# Patient Record
Sex: Male | Born: 1945
Health system: Southern US, Community
[De-identification: ages and names within clinical notes are randomized; demographics above are authoritative.]

## PROBLEM LIST (undated history)

## (undated) DIAGNOSIS — I10 Essential (primary) hypertension: Secondary | ICD-10-CM

## (undated) DIAGNOSIS — F4310 Post-traumatic stress disorder, unspecified: Secondary | ICD-10-CM

## (undated) DIAGNOSIS — C801 Malignant (primary) neoplasm, unspecified: Secondary | ICD-10-CM

## (undated) DIAGNOSIS — J449 Chronic obstructive pulmonary disease, unspecified: Secondary | ICD-10-CM

## (undated) DIAGNOSIS — E119 Type 2 diabetes mellitus without complications: Secondary | ICD-10-CM

## (undated) DIAGNOSIS — E7800 Pure hypercholesterolemia, unspecified: Secondary | ICD-10-CM

## (undated) HISTORY — PX: ABDOMINAL SURGERY: SHX537

## (undated) HISTORY — PX: CHOLECYSTECTOMY: SHX55

---

## 2010-07-07 ENCOUNTER — Emergency Department: Payer: Self-pay | Admitting: Emergency Medicine

## 2010-09-22 ENCOUNTER — Ambulatory Visit: Payer: Self-pay | Admitting: Family Medicine

## 2010-10-07 ENCOUNTER — Ambulatory Visit: Payer: Self-pay | Admitting: Family Medicine

## 2010-11-05 ENCOUNTER — Ambulatory Visit: Payer: Self-pay | Admitting: Family Medicine

## 2012-08-10 ENCOUNTER — Ambulatory Visit: Payer: Self-pay | Admitting: Internal Medicine

## 2012-11-04 ENCOUNTER — Ambulatory Visit: Payer: Self-pay | Admitting: Emergency Medicine

## 2013-09-07 DIAGNOSIS — M999 Biomechanical lesion, unspecified: Secondary | ICD-10-CM | POA: Diagnosis not present

## 2013-09-07 DIAGNOSIS — M9981 Other biomechanical lesions of cervical region: Secondary | ICD-10-CM | POA: Diagnosis not present

## 2013-09-07 DIAGNOSIS — M436 Torticollis: Secondary | ICD-10-CM | POA: Diagnosis not present

## 2013-09-07 DIAGNOSIS — M5412 Radiculopathy, cervical region: Secondary | ICD-10-CM | POA: Diagnosis not present

## 2013-10-16 DIAGNOSIS — H04129 Dry eye syndrome of unspecified lacrimal gland: Secondary | ICD-10-CM | POA: Diagnosis not present

## 2013-10-24 ENCOUNTER — Ambulatory Visit: Payer: Self-pay | Admitting: Family Medicine

## 2013-10-24 DIAGNOSIS — J438 Other emphysema: Secondary | ICD-10-CM | POA: Diagnosis not present

## 2013-12-07 DIAGNOSIS — I259 Chronic ischemic heart disease, unspecified: Secondary | ICD-10-CM | POA: Diagnosis not present

## 2013-12-07 DIAGNOSIS — I10 Essential (primary) hypertension: Secondary | ICD-10-CM | POA: Diagnosis not present

## 2013-12-07 DIAGNOSIS — E785 Hyperlipidemia, unspecified: Secondary | ICD-10-CM | POA: Diagnosis not present

## 2013-12-07 DIAGNOSIS — R42 Dizziness and giddiness: Secondary | ICD-10-CM | POA: Diagnosis not present

## 2014-01-06 DIAGNOSIS — G4733 Obstructive sleep apnea (adult) (pediatric): Secondary | ICD-10-CM | POA: Diagnosis not present

## 2014-01-11 DIAGNOSIS — R1900 Intra-abdominal and pelvic swelling, mass and lump, unspecified site: Secondary | ICD-10-CM | POA: Diagnosis not present

## 2014-01-11 DIAGNOSIS — R1909 Other intra-abdominal and pelvic swelling, mass and lump: Secondary | ICD-10-CM | POA: Diagnosis not present

## 2014-01-11 DIAGNOSIS — Z85048 Personal history of other malignant neoplasm of rectum, rectosigmoid junction, and anus: Secondary | ICD-10-CM | POA: Diagnosis not present

## 2014-01-18 DIAGNOSIS — R3915 Urgency of urination: Secondary | ICD-10-CM | POA: Diagnosis present

## 2014-01-18 DIAGNOSIS — G4700 Insomnia, unspecified: Secondary | ICD-10-CM | POA: Diagnosis present

## 2014-01-18 DIAGNOSIS — E785 Hyperlipidemia, unspecified: Secondary | ICD-10-CM | POA: Diagnosis not present

## 2014-01-18 DIAGNOSIS — M4802 Spinal stenosis, cervical region: Secondary | ICD-10-CM | POA: Diagnosis present

## 2014-01-18 DIAGNOSIS — I10 Essential (primary) hypertension: Secondary | ICD-10-CM | POA: Diagnosis present

## 2014-01-18 DIAGNOSIS — Z87891 Personal history of nicotine dependence: Secondary | ICD-10-CM | POA: Diagnosis not present

## 2014-01-18 DIAGNOSIS — N401 Enlarged prostate with lower urinary tract symptoms: Secondary | ICD-10-CM | POA: Diagnosis present

## 2014-01-18 DIAGNOSIS — M5412 Radiculopathy, cervical region: Secondary | ICD-10-CM | POA: Diagnosis not present

## 2014-01-18 DIAGNOSIS — Z85038 Personal history of other malignant neoplasm of large intestine: Secondary | ICD-10-CM | POA: Diagnosis not present

## 2014-01-18 DIAGNOSIS — R079 Chest pain, unspecified: Secondary | ICD-10-CM | POA: Diagnosis not present

## 2014-01-18 DIAGNOSIS — R279 Unspecified lack of coordination: Secondary | ICD-10-CM | POA: Diagnosis not present

## 2014-01-18 DIAGNOSIS — R5381 Other malaise: Secondary | ICD-10-CM | POA: Diagnosis not present

## 2014-01-18 DIAGNOSIS — R1312 Dysphagia, oropharyngeal phase: Secondary | ICD-10-CM | POA: Diagnosis not present

## 2014-01-18 DIAGNOSIS — I2510 Atherosclerotic heart disease of native coronary artery without angina pectoris: Secondary | ICD-10-CM | POA: Diagnosis present

## 2014-01-18 DIAGNOSIS — I69998 Other sequelae following unspecified cerebrovascular disease: Secondary | ICD-10-CM | POA: Diagnosis not present

## 2014-01-18 DIAGNOSIS — E119 Type 2 diabetes mellitus without complications: Secondary | ICD-10-CM | POA: Diagnosis present

## 2014-01-18 DIAGNOSIS — M47812 Spondylosis without myelopathy or radiculopathy, cervical region: Secondary | ICD-10-CM | POA: Diagnosis not present

## 2014-01-18 DIAGNOSIS — R29898 Other symptoms and signs involving the musculoskeletal system: Secondary | ICD-10-CM | POA: Diagnosis present

## 2014-01-18 DIAGNOSIS — R5383 Other fatigue: Secondary | ICD-10-CM | POA: Diagnosis not present

## 2014-01-18 DIAGNOSIS — Z8673 Personal history of transient ischemic attack (TIA), and cerebral infarction without residual deficits: Secondary | ICD-10-CM | POA: Diagnosis not present

## 2014-01-18 DIAGNOSIS — F4310 Post-traumatic stress disorder, unspecified: Secondary | ICD-10-CM | POA: Diagnosis present

## 2014-01-18 DIAGNOSIS — M5030 Other cervical disc degeneration, unspecified cervical region: Secondary | ICD-10-CM | POA: Diagnosis not present

## 2014-01-18 DIAGNOSIS — R29818 Other symptoms and signs involving the nervous system: Secondary | ICD-10-CM | POA: Diagnosis not present

## 2014-01-18 DIAGNOSIS — G4733 Obstructive sleep apnea (adult) (pediatric): Secondary | ICD-10-CM | POA: Diagnosis present

## 2014-01-18 DIAGNOSIS — Z9861 Coronary angioplasty status: Secondary | ICD-10-CM | POA: Diagnosis not present

## 2014-01-18 DIAGNOSIS — R209 Unspecified disturbances of skin sensation: Secondary | ICD-10-CM | POA: Diagnosis not present

## 2014-01-18 DIAGNOSIS — J3489 Other specified disorders of nose and nasal sinuses: Secondary | ICD-10-CM | POA: Diagnosis not present

## 2014-01-18 DIAGNOSIS — I6350 Cerebral infarction due to unspecified occlusion or stenosis of unspecified cerebral artery: Secondary | ICD-10-CM | POA: Diagnosis not present

## 2014-01-18 DIAGNOSIS — K219 Gastro-esophageal reflux disease without esophagitis: Secondary | ICD-10-CM | POA: Diagnosis present

## 2014-02-11 DIAGNOSIS — M79609 Pain in unspecified limb: Secondary | ICD-10-CM | POA: Diagnosis not present

## 2014-02-11 DIAGNOSIS — M9981 Other biomechanical lesions of cervical region: Secondary | ICD-10-CM | POA: Diagnosis not present

## 2014-02-11 DIAGNOSIS — M999 Biomechanical lesion, unspecified: Secondary | ICD-10-CM | POA: Diagnosis not present

## 2014-02-12 DIAGNOSIS — M79609 Pain in unspecified limb: Secondary | ICD-10-CM | POA: Diagnosis not present

## 2014-02-12 DIAGNOSIS — M9981 Other biomechanical lesions of cervical region: Secondary | ICD-10-CM | POA: Diagnosis not present

## 2014-02-12 DIAGNOSIS — M999 Biomechanical lesion, unspecified: Secondary | ICD-10-CM | POA: Diagnosis not present

## 2014-02-14 DIAGNOSIS — M999 Biomechanical lesion, unspecified: Secondary | ICD-10-CM | POA: Diagnosis not present

## 2014-02-14 DIAGNOSIS — M9981 Other biomechanical lesions of cervical region: Secondary | ICD-10-CM | POA: Diagnosis not present

## 2014-02-14 DIAGNOSIS — M79609 Pain in unspecified limb: Secondary | ICD-10-CM | POA: Diagnosis not present

## 2014-02-22 DIAGNOSIS — M545 Low back pain, unspecified: Secondary | ICD-10-CM | POA: Diagnosis not present

## 2014-02-22 DIAGNOSIS — M999 Biomechanical lesion, unspecified: Secondary | ICD-10-CM | POA: Diagnosis not present

## 2014-02-28 DIAGNOSIS — M545 Low back pain, unspecified: Secondary | ICD-10-CM | POA: Diagnosis not present

## 2014-02-28 DIAGNOSIS — M999 Biomechanical lesion, unspecified: Secondary | ICD-10-CM | POA: Diagnosis not present

## 2014-03-05 ENCOUNTER — Ambulatory Visit: Payer: Self-pay | Admitting: Emergency Medicine

## 2014-03-05 DIAGNOSIS — E119 Type 2 diabetes mellitus without complications: Secondary | ICD-10-CM | POA: Diagnosis not present

## 2014-03-05 DIAGNOSIS — Z8673 Personal history of transient ischemic attack (TIA), and cerebral infarction without residual deficits: Secondary | ICD-10-CM | POA: Diagnosis not present

## 2014-03-05 DIAGNOSIS — E7800 Pure hypercholesterolemia, unspecified: Secondary | ICD-10-CM | POA: Diagnosis not present

## 2014-03-05 DIAGNOSIS — Z79899 Other long term (current) drug therapy: Secondary | ICD-10-CM | POA: Diagnosis not present

## 2014-03-05 DIAGNOSIS — F17200 Nicotine dependence, unspecified, uncomplicated: Secondary | ICD-10-CM | POA: Diagnosis not present

## 2014-03-05 DIAGNOSIS — Z794 Long term (current) use of insulin: Secondary | ICD-10-CM | POA: Diagnosis not present

## 2014-03-05 DIAGNOSIS — S61209A Unspecified open wound of unspecified finger without damage to nail, initial encounter: Secondary | ICD-10-CM | POA: Diagnosis not present

## 2014-03-05 DIAGNOSIS — I10 Essential (primary) hypertension: Secondary | ICD-10-CM | POA: Diagnosis not present

## 2014-03-12 DIAGNOSIS — M9981 Other biomechanical lesions of cervical region: Secondary | ICD-10-CM | POA: Diagnosis not present

## 2014-03-12 DIAGNOSIS — R209 Unspecified disturbances of skin sensation: Secondary | ICD-10-CM | POA: Diagnosis not present

## 2014-03-12 DIAGNOSIS — M999 Biomechanical lesion, unspecified: Secondary | ICD-10-CM | POA: Diagnosis not present

## 2014-03-12 DIAGNOSIS — M546 Pain in thoracic spine: Secondary | ICD-10-CM | POA: Diagnosis not present

## 2014-03-13 DIAGNOSIS — F3289 Other specified depressive episodes: Secondary | ICD-10-CM | POA: Diagnosis not present

## 2014-03-13 DIAGNOSIS — J310 Chronic rhinitis: Secondary | ICD-10-CM | POA: Diagnosis not present

## 2014-03-13 DIAGNOSIS — G4733 Obstructive sleep apnea (adult) (pediatric): Secondary | ICD-10-CM | POA: Diagnosis not present

## 2014-03-13 DIAGNOSIS — F329 Major depressive disorder, single episode, unspecified: Secondary | ICD-10-CM | POA: Diagnosis not present

## 2014-04-02 DIAGNOSIS — M9981 Other biomechanical lesions of cervical region: Secondary | ICD-10-CM | POA: Diagnosis not present

## 2014-04-02 DIAGNOSIS — M999 Biomechanical lesion, unspecified: Secondary | ICD-10-CM | POA: Diagnosis not present

## 2014-04-02 DIAGNOSIS — M546 Pain in thoracic spine: Secondary | ICD-10-CM | POA: Diagnosis not present

## 2014-04-02 DIAGNOSIS — R209 Unspecified disturbances of skin sensation: Secondary | ICD-10-CM | POA: Diagnosis not present

## 2014-04-12 DIAGNOSIS — M999 Biomechanical lesion, unspecified: Secondary | ICD-10-CM | POA: Diagnosis not present

## 2014-04-12 DIAGNOSIS — M9981 Other biomechanical lesions of cervical region: Secondary | ICD-10-CM | POA: Diagnosis not present

## 2014-04-12 DIAGNOSIS — M79609 Pain in unspecified limb: Secondary | ICD-10-CM | POA: Diagnosis not present

## 2014-04-12 DIAGNOSIS — M546 Pain in thoracic spine: Secondary | ICD-10-CM | POA: Diagnosis not present

## 2014-04-15 DIAGNOSIS — M546 Pain in thoracic spine: Secondary | ICD-10-CM | POA: Diagnosis not present

## 2014-04-15 DIAGNOSIS — M79609 Pain in unspecified limb: Secondary | ICD-10-CM | POA: Diagnosis not present

## 2014-04-15 DIAGNOSIS — M999 Biomechanical lesion, unspecified: Secondary | ICD-10-CM | POA: Diagnosis not present

## 2014-04-15 DIAGNOSIS — M9981 Other biomechanical lesions of cervical region: Secondary | ICD-10-CM | POA: Diagnosis not present

## 2014-04-16 DIAGNOSIS — M9981 Other biomechanical lesions of cervical region: Secondary | ICD-10-CM | POA: Diagnosis not present

## 2014-04-16 DIAGNOSIS — M546 Pain in thoracic spine: Secondary | ICD-10-CM | POA: Diagnosis not present

## 2014-04-16 DIAGNOSIS — M999 Biomechanical lesion, unspecified: Secondary | ICD-10-CM | POA: Diagnosis not present

## 2014-04-16 DIAGNOSIS — M79609 Pain in unspecified limb: Secondary | ICD-10-CM | POA: Diagnosis not present

## 2014-04-23 DIAGNOSIS — M9981 Other biomechanical lesions of cervical region: Secondary | ICD-10-CM | POA: Diagnosis not present

## 2014-04-23 DIAGNOSIS — M999 Biomechanical lesion, unspecified: Secondary | ICD-10-CM | POA: Diagnosis not present

## 2014-04-23 DIAGNOSIS — M79609 Pain in unspecified limb: Secondary | ICD-10-CM | POA: Diagnosis not present

## 2014-05-11 ENCOUNTER — Ambulatory Visit: Payer: Self-pay | Admitting: Family Medicine

## 2014-05-11 DIAGNOSIS — N419 Inflammatory disease of prostate, unspecified: Secondary | ICD-10-CM | POA: Diagnosis not present

## 2014-05-11 DIAGNOSIS — I2510 Atherosclerotic heart disease of native coronary artery without angina pectoris: Secondary | ICD-10-CM | POA: Diagnosis not present

## 2014-05-11 DIAGNOSIS — I10 Essential (primary) hypertension: Secondary | ICD-10-CM | POA: Diagnosis not present

## 2014-05-11 DIAGNOSIS — Z9861 Coronary angioplasty status: Secondary | ICD-10-CM | POA: Diagnosis not present

## 2014-05-11 DIAGNOSIS — E119 Type 2 diabetes mellitus without complications: Secondary | ICD-10-CM | POA: Diagnosis not present

## 2014-05-11 DIAGNOSIS — E7800 Pure hypercholesterolemia, unspecified: Secondary | ICD-10-CM | POA: Diagnosis not present

## 2014-05-11 LAB — URINALYSIS, COMPLETE
Bacteria: NEGATIVE
Ketone: NEGATIVE
LEUKOCYTE ESTERASE: NEGATIVE
Nitrite: NEGATIVE
PH: 6 (ref 5.0–8.0)
Protein: NEGATIVE
SQUAMOUS EPITHELIAL: NONE SEEN
Specific Gravity: 1.01 (ref 1.000–1.030)

## 2014-05-20 DIAGNOSIS — M999 Biomechanical lesion, unspecified: Secondary | ICD-10-CM | POA: Diagnosis not present

## 2014-05-20 DIAGNOSIS — M9981 Other biomechanical lesions of cervical region: Secondary | ICD-10-CM | POA: Diagnosis not present

## 2014-05-20 DIAGNOSIS — M79609 Pain in unspecified limb: Secondary | ICD-10-CM | POA: Diagnosis not present

## 2014-06-10 DIAGNOSIS — I2510 Atherosclerotic heart disease of native coronary artery without angina pectoris: Secondary | ICD-10-CM | POA: Diagnosis not present

## 2014-06-10 DIAGNOSIS — I10 Essential (primary) hypertension: Secondary | ICD-10-CM | POA: Diagnosis not present

## 2014-06-10 DIAGNOSIS — R0981 Nasal congestion: Secondary | ICD-10-CM | POA: Diagnosis not present

## 2014-06-10 DIAGNOSIS — E780 Pure hypercholesterolemia: Secondary | ICD-10-CM | POA: Diagnosis not present

## 2014-06-17 DIAGNOSIS — I2510 Atherosclerotic heart disease of native coronary artery without angina pectoris: Secondary | ICD-10-CM | POA: Diagnosis not present

## 2014-06-17 DIAGNOSIS — R0789 Other chest pain: Secondary | ICD-10-CM | POA: Diagnosis not present

## 2014-06-19 DIAGNOSIS — G4733 Obstructive sleep apnea (adult) (pediatric): Secondary | ICD-10-CM | POA: Diagnosis not present

## 2014-06-19 DIAGNOSIS — J309 Allergic rhinitis, unspecified: Secondary | ICD-10-CM | POA: Diagnosis not present

## 2014-06-19 DIAGNOSIS — J343 Hypertrophy of nasal turbinates: Secondary | ICD-10-CM | POA: Diagnosis not present

## 2014-06-19 DIAGNOSIS — J342 Deviated nasal septum: Secondary | ICD-10-CM | POA: Diagnosis not present

## 2014-06-19 DIAGNOSIS — R0602 Shortness of breath: Secondary | ICD-10-CM | POA: Diagnosis not present

## 2014-06-19 DIAGNOSIS — J324 Chronic pansinusitis: Secondary | ICD-10-CM | POA: Diagnosis not present

## 2014-07-09 DIAGNOSIS — M546 Pain in thoracic spine: Secondary | ICD-10-CM | POA: Diagnosis not present

## 2014-07-09 DIAGNOSIS — M5136 Other intervertebral disc degeneration, lumbar region: Secondary | ICD-10-CM | POA: Diagnosis not present

## 2014-07-09 DIAGNOSIS — M5137 Other intervertebral disc degeneration, lumbosacral region: Secondary | ICD-10-CM | POA: Diagnosis not present

## 2014-07-09 DIAGNOSIS — M9901 Segmental and somatic dysfunction of cervical region: Secondary | ICD-10-CM | POA: Diagnosis not present

## 2014-07-09 DIAGNOSIS — M542 Cervicalgia: Secondary | ICD-10-CM | POA: Diagnosis not present

## 2014-07-09 DIAGNOSIS — M9902 Segmental and somatic dysfunction of thoracic region: Secondary | ICD-10-CM | POA: Diagnosis not present

## 2014-08-14 DIAGNOSIS — M1712 Unilateral primary osteoarthritis, left knee: Secondary | ICD-10-CM | POA: Diagnosis not present

## 2014-09-04 DIAGNOSIS — J329 Chronic sinusitis, unspecified: Secondary | ICD-10-CM | POA: Diagnosis not present

## 2014-09-04 DIAGNOSIS — J324 Chronic pansinusitis: Secondary | ICD-10-CM | POA: Diagnosis not present

## 2014-09-25 DIAGNOSIS — Z01818 Encounter for other preprocedural examination: Secondary | ICD-10-CM | POA: Diagnosis not present

## 2014-09-30 DIAGNOSIS — K219 Gastro-esophageal reflux disease without esophagitis: Secondary | ICD-10-CM | POA: Diagnosis present

## 2014-09-30 DIAGNOSIS — M179 Osteoarthritis of knee, unspecified: Secondary | ICD-10-CM | POA: Diagnosis present

## 2014-09-30 DIAGNOSIS — Z955 Presence of coronary angioplasty implant and graft: Secondary | ICD-10-CM | POA: Diagnosis not present

## 2014-09-30 DIAGNOSIS — E119 Type 2 diabetes mellitus without complications: Secondary | ICD-10-CM | POA: Diagnosis present

## 2014-09-30 DIAGNOSIS — Z85038 Personal history of other malignant neoplasm of large intestine: Secondary | ICD-10-CM | POA: Diagnosis not present

## 2014-09-30 DIAGNOSIS — Z880 Allergy status to penicillin: Secondary | ICD-10-CM | POA: Diagnosis not present

## 2014-09-30 DIAGNOSIS — I25119 Atherosclerotic heart disease of native coronary artery with unspecified angina pectoris: Secondary | ICD-10-CM | POA: Diagnosis present

## 2014-09-30 DIAGNOSIS — Z888 Allergy status to other drugs, medicaments and biological substances status: Secondary | ICD-10-CM | POA: Diagnosis not present

## 2014-09-30 DIAGNOSIS — I2510 Atherosclerotic heart disease of native coronary artery without angina pectoris: Secondary | ICD-10-CM | POA: Diagnosis not present

## 2014-09-30 DIAGNOSIS — Z8673 Personal history of transient ischemic attack (TIA), and cerebral infarction without residual deficits: Secondary | ICD-10-CM | POA: Diagnosis not present

## 2014-09-30 DIAGNOSIS — R071 Chest pain on breathing: Secondary | ICD-10-CM | POA: Diagnosis not present

## 2014-09-30 DIAGNOSIS — Z87891 Personal history of nicotine dependence: Secondary | ICD-10-CM | POA: Diagnosis not present

## 2014-09-30 DIAGNOSIS — Z6834 Body mass index (BMI) 34.0-34.9, adult: Secondary | ICD-10-CM | POA: Diagnosis not present

## 2014-09-30 DIAGNOSIS — F418 Other specified anxiety disorders: Secondary | ICD-10-CM | POA: Diagnosis present

## 2014-09-30 DIAGNOSIS — D696 Thrombocytopenia, unspecified: Secondary | ICD-10-CM | POA: Diagnosis not present

## 2014-09-30 DIAGNOSIS — M25562 Pain in left knee: Secondary | ICD-10-CM | POA: Diagnosis not present

## 2014-09-30 DIAGNOSIS — M1712 Unilateral primary osteoarthritis, left knee: Secondary | ICD-10-CM | POA: Diagnosis not present

## 2014-09-30 DIAGNOSIS — E669 Obesity, unspecified: Secondary | ICD-10-CM | POA: Diagnosis present

## 2014-09-30 DIAGNOSIS — E871 Hypo-osmolality and hyponatremia: Secondary | ICD-10-CM | POA: Diagnosis not present

## 2014-09-30 DIAGNOSIS — I252 Old myocardial infarction: Secondary | ICD-10-CM | POA: Diagnosis not present

## 2014-09-30 DIAGNOSIS — R079 Chest pain, unspecified: Secondary | ICD-10-CM | POA: Diagnosis not present

## 2014-09-30 DIAGNOSIS — I10 Essential (primary) hypertension: Secondary | ICD-10-CM | POA: Diagnosis present

## 2014-09-30 DIAGNOSIS — E785 Hyperlipidemia, unspecified: Secondary | ICD-10-CM | POA: Diagnosis present

## 2014-09-30 DIAGNOSIS — G4733 Obstructive sleep apnea (adult) (pediatric): Secondary | ICD-10-CM | POA: Diagnosis present

## 2014-09-30 DIAGNOSIS — Z794 Long term (current) use of insulin: Secondary | ICD-10-CM | POA: Diagnosis not present

## 2014-09-30 DIAGNOSIS — N289 Disorder of kidney and ureter, unspecified: Secondary | ICD-10-CM | POA: Diagnosis not present

## 2014-10-05 DIAGNOSIS — I252 Old myocardial infarction: Secondary | ICD-10-CM | POA: Diagnosis not present

## 2014-10-05 DIAGNOSIS — Z471 Aftercare following joint replacement surgery: Secondary | ICD-10-CM | POA: Diagnosis not present

## 2014-10-05 DIAGNOSIS — Z96652 Presence of left artificial knee joint: Secondary | ICD-10-CM | POA: Diagnosis not present

## 2014-10-05 DIAGNOSIS — E119 Type 2 diabetes mellitus without complications: Secondary | ICD-10-CM | POA: Diagnosis not present

## 2014-10-05 DIAGNOSIS — Z794 Long term (current) use of insulin: Secondary | ICD-10-CM | POA: Diagnosis not present

## 2014-10-05 DIAGNOSIS — F329 Major depressive disorder, single episode, unspecified: Secondary | ICD-10-CM | POA: Diagnosis not present

## 2014-10-05 DIAGNOSIS — G4733 Obstructive sleep apnea (adult) (pediatric): Secondary | ICD-10-CM | POA: Diagnosis not present

## 2014-10-05 DIAGNOSIS — I10 Essential (primary) hypertension: Secondary | ICD-10-CM | POA: Diagnosis not present

## 2014-10-07 DIAGNOSIS — E119 Type 2 diabetes mellitus without complications: Secondary | ICD-10-CM | POA: Diagnosis not present

## 2014-10-07 DIAGNOSIS — I10 Essential (primary) hypertension: Secondary | ICD-10-CM | POA: Diagnosis not present

## 2014-10-07 DIAGNOSIS — F329 Major depressive disorder, single episode, unspecified: Secondary | ICD-10-CM | POA: Diagnosis not present

## 2014-10-07 DIAGNOSIS — G4733 Obstructive sleep apnea (adult) (pediatric): Secondary | ICD-10-CM | POA: Diagnosis not present

## 2014-10-07 DIAGNOSIS — Z471 Aftercare following joint replacement surgery: Secondary | ICD-10-CM | POA: Diagnosis not present

## 2014-10-07 DIAGNOSIS — I252 Old myocardial infarction: Secondary | ICD-10-CM | POA: Diagnosis not present

## 2014-10-08 DIAGNOSIS — I10 Essential (primary) hypertension: Secondary | ICD-10-CM | POA: Diagnosis not present

## 2014-10-08 DIAGNOSIS — Z471 Aftercare following joint replacement surgery: Secondary | ICD-10-CM | POA: Diagnosis not present

## 2014-10-08 DIAGNOSIS — F329 Major depressive disorder, single episode, unspecified: Secondary | ICD-10-CM | POA: Diagnosis not present

## 2014-10-08 DIAGNOSIS — I252 Old myocardial infarction: Secondary | ICD-10-CM | POA: Diagnosis not present

## 2014-10-08 DIAGNOSIS — E119 Type 2 diabetes mellitus without complications: Secondary | ICD-10-CM | POA: Diagnosis not present

## 2014-10-08 DIAGNOSIS — G4733 Obstructive sleep apnea (adult) (pediatric): Secondary | ICD-10-CM | POA: Diagnosis not present

## 2014-10-09 DIAGNOSIS — I10 Essential (primary) hypertension: Secondary | ICD-10-CM | POA: Diagnosis not present

## 2014-10-09 DIAGNOSIS — I252 Old myocardial infarction: Secondary | ICD-10-CM | POA: Diagnosis not present

## 2014-10-09 DIAGNOSIS — F329 Major depressive disorder, single episode, unspecified: Secondary | ICD-10-CM | POA: Diagnosis not present

## 2014-10-09 DIAGNOSIS — Z471 Aftercare following joint replacement surgery: Secondary | ICD-10-CM | POA: Diagnosis not present

## 2014-10-09 DIAGNOSIS — G4733 Obstructive sleep apnea (adult) (pediatric): Secondary | ICD-10-CM | POA: Diagnosis not present

## 2014-10-09 DIAGNOSIS — E119 Type 2 diabetes mellitus without complications: Secondary | ICD-10-CM | POA: Diagnosis not present

## 2014-10-10 DIAGNOSIS — G4733 Obstructive sleep apnea (adult) (pediatric): Secondary | ICD-10-CM | POA: Diagnosis not present

## 2014-10-10 DIAGNOSIS — Z471 Aftercare following joint replacement surgery: Secondary | ICD-10-CM | POA: Diagnosis not present

## 2014-10-10 DIAGNOSIS — F329 Major depressive disorder, single episode, unspecified: Secondary | ICD-10-CM | POA: Diagnosis not present

## 2014-10-10 DIAGNOSIS — I10 Essential (primary) hypertension: Secondary | ICD-10-CM | POA: Diagnosis not present

## 2014-10-10 DIAGNOSIS — E119 Type 2 diabetes mellitus without complications: Secondary | ICD-10-CM | POA: Diagnosis not present

## 2014-10-10 DIAGNOSIS — I252 Old myocardial infarction: Secondary | ICD-10-CM | POA: Diagnosis not present

## 2014-10-14 DIAGNOSIS — E119 Type 2 diabetes mellitus without complications: Secondary | ICD-10-CM | POA: Diagnosis not present

## 2014-10-14 DIAGNOSIS — F329 Major depressive disorder, single episode, unspecified: Secondary | ICD-10-CM | POA: Diagnosis not present

## 2014-10-14 DIAGNOSIS — I252 Old myocardial infarction: Secondary | ICD-10-CM | POA: Diagnosis not present

## 2014-10-14 DIAGNOSIS — I10 Essential (primary) hypertension: Secondary | ICD-10-CM | POA: Diagnosis not present

## 2014-10-14 DIAGNOSIS — Z471 Aftercare following joint replacement surgery: Secondary | ICD-10-CM | POA: Diagnosis not present

## 2014-10-14 DIAGNOSIS — G4733 Obstructive sleep apnea (adult) (pediatric): Secondary | ICD-10-CM | POA: Diagnosis not present

## 2014-10-15 DIAGNOSIS — Z96652 Presence of left artificial knee joint: Secondary | ICD-10-CM | POA: Diagnosis not present

## 2014-10-16 DIAGNOSIS — Z471 Aftercare following joint replacement surgery: Secondary | ICD-10-CM | POA: Diagnosis not present

## 2014-10-16 DIAGNOSIS — I252 Old myocardial infarction: Secondary | ICD-10-CM | POA: Diagnosis not present

## 2014-10-16 DIAGNOSIS — I10 Essential (primary) hypertension: Secondary | ICD-10-CM | POA: Diagnosis not present

## 2014-10-16 DIAGNOSIS — E119 Type 2 diabetes mellitus without complications: Secondary | ICD-10-CM | POA: Diagnosis not present

## 2014-10-16 DIAGNOSIS — G4733 Obstructive sleep apnea (adult) (pediatric): Secondary | ICD-10-CM | POA: Diagnosis not present

## 2014-10-16 DIAGNOSIS — F329 Major depressive disorder, single episode, unspecified: Secondary | ICD-10-CM | POA: Diagnosis not present

## 2014-10-17 DIAGNOSIS — E119 Type 2 diabetes mellitus without complications: Secondary | ICD-10-CM | POA: Diagnosis not present

## 2014-10-17 DIAGNOSIS — I10 Essential (primary) hypertension: Secondary | ICD-10-CM | POA: Diagnosis not present

## 2014-10-17 DIAGNOSIS — I252 Old myocardial infarction: Secondary | ICD-10-CM | POA: Diagnosis not present

## 2014-10-17 DIAGNOSIS — F329 Major depressive disorder, single episode, unspecified: Secondary | ICD-10-CM | POA: Diagnosis not present

## 2014-10-17 DIAGNOSIS — G4733 Obstructive sleep apnea (adult) (pediatric): Secondary | ICD-10-CM | POA: Diagnosis not present

## 2014-10-17 DIAGNOSIS — Z471 Aftercare following joint replacement surgery: Secondary | ICD-10-CM | POA: Diagnosis not present

## 2014-10-31 DIAGNOSIS — M25562 Pain in left knee: Secondary | ICD-10-CM | POA: Diagnosis not present

## 2014-11-01 DIAGNOSIS — M25562 Pain in left knee: Secondary | ICD-10-CM | POA: Diagnosis not present

## 2014-11-08 DIAGNOSIS — M25562 Pain in left knee: Secondary | ICD-10-CM | POA: Diagnosis not present

## 2014-11-11 DIAGNOSIS — M25562 Pain in left knee: Secondary | ICD-10-CM | POA: Diagnosis not present

## 2014-11-13 DIAGNOSIS — M25562 Pain in left knee: Secondary | ICD-10-CM | POA: Diagnosis not present

## 2014-11-19 DIAGNOSIS — M25562 Pain in left knee: Secondary | ICD-10-CM | POA: Diagnosis not present

## 2014-11-22 DIAGNOSIS — M25562 Pain in left knee: Secondary | ICD-10-CM | POA: Diagnosis not present

## 2014-11-28 DIAGNOSIS — Z471 Aftercare following joint replacement surgery: Secondary | ICD-10-CM | POA: Diagnosis not present

## 2015-03-31 DIAGNOSIS — M25562 Pain in left knee: Secondary | ICD-10-CM | POA: Diagnosis not present

## 2015-06-11 DIAGNOSIS — I2510 Atherosclerotic heart disease of native coronary artery without angina pectoris: Secondary | ICD-10-CM | POA: Diagnosis not present

## 2015-06-11 DIAGNOSIS — I10 Essential (primary) hypertension: Secondary | ICD-10-CM | POA: Diagnosis not present

## 2015-06-11 DIAGNOSIS — E782 Mixed hyperlipidemia: Secondary | ICD-10-CM | POA: Diagnosis not present

## 2015-08-21 ENCOUNTER — Ambulatory Visit
Admission: EM | Admit: 2015-08-21 | Discharge: 2015-08-21 | Disposition: A | Discharge status: Home / Self Care | Payer: Medicare Other | Attending: Family Medicine | Admitting: Family Medicine

## 2015-08-21 DIAGNOSIS — R109 Unspecified abdominal pain: Secondary | ICD-10-CM | POA: Insufficient documentation

## 2015-08-21 DIAGNOSIS — M533 Sacrococcygeal disorders, not elsewhere classified: Secondary | ICD-10-CM

## 2015-08-21 DIAGNOSIS — M461 Sacroiliitis, not elsewhere classified: Secondary | ICD-10-CM | POA: Diagnosis not present

## 2015-08-21 HISTORY — DX: Essential (primary) hypertension: I10

## 2015-08-21 HISTORY — DX: Chronic obstructive pulmonary disease, unspecified: J44.9

## 2015-08-21 HISTORY — DX: Malignant (primary) neoplasm, unspecified: C80.1

## 2015-08-21 HISTORY — DX: Type 2 diabetes mellitus without complications: E11.9

## 2015-08-21 HISTORY — DX: Pure hypercholesterolemia, unspecified: E78.00

## 2015-08-21 HISTORY — DX: Post-traumatic stress disorder, unspecified: F43.10

## 2015-08-21 LAB — URINALYSIS COMPLETE WITH MICROSCOPIC (ARMC ONLY)
BACTERIA UA: NONE SEEN
BILIRUBIN URINE: NEGATIVE
Glucose, UA: NEGATIVE mg/dL
HGB URINE DIPSTICK: NEGATIVE
Ketones, ur: NEGATIVE mg/dL
LEUKOCYTES UA: NEGATIVE
NITRITE: NEGATIVE
PH: 7 (ref 5.0–8.0)
Protein, ur: NEGATIVE mg/dL
RBC / HPF: NONE SEEN RBC/hpf (ref 0–5)
Specific Gravity, Urine: 1.015 (ref 1.005–1.030)
WBC, UA: NONE SEEN WBC/hpf (ref 0–5)

## 2015-08-21 MED ORDER — MELOXICAM 7.5 MG PO TABS: ORAL_TABLET | ORAL | Status: DC

## 2015-08-21 MED ORDER — KETOROLAC TROMETHAMINE 60 MG/2ML IM SOLN
30.0000 mg | Freq: Once | INTRAMUSCULAR | Status: AC
  Administered 2015-08-21: 30 mg via INTRAMUSCULAR

## 2015-08-21 NOTE — ED Provider Notes (Signed)
CSN: [id number]     Arrival date & time 08/21/15  0825 History   First MD Initiated Contact with Patient 08/21/15 [phone]    Nurses notes were reviewed. Chief Complaint  Patient presents with  . Flank Pain   Patient reports having right flank pain for about a week. He states that he first noticed it about a week ago when he got out of bed but it has progressively gotten worse and is causing him more difficulty. States this mostly painful when he moves and when he walks about  (Consider location/radiation/quality/duration/timing/severity/associated sxs/prior Treatment) Patient is a 69 y.o. male presenting with flank pain. The history is provided by the spouse and the patient. No language interpreter was used.  Flank Pain This is a new problem. The current episode started more than 1 week ago. The problem occurs constantly. The problem has been gradually worsening. Pertinent negatives include no chest pain, no abdominal pain, no headaches and no shortness of breath. The symptoms are aggravated by walking. Nothing relieves the symptoms. He has tried nothing for the symptoms.    Past Medical History  Diagnosis Date  . Hypertension   . Diabetes mellitus without complication ([place])   . COPD (chronic obstructive pulmonary disease) ([place])   . Hypercholesteremia   . PTSD (post-traumatic stress disorder)   . Cancer [facility])    Past Surgical History  Procedure Laterality Date  . Abdominal surgery    . Cholecystectomy     Family History  Problem Relation Age of Onset  . Cancer Mother   . Lead poisoning Father    Social History  Substance Use Topics  . Smoking status: Former Research scientist (life sciences)  . Smokeless tobacco: None  . Alcohol Use: No    Review of Systems  Respiratory: Negative for shortness of breath.   Cardiovascular: Negative for chest pain.  Gastrointestinal: Negative for abdominal pain.  Genitourinary: Positive for flank pain.  Neurological: Negative for headaches.  All other systems reviewed  and are negative.   Allergies  Penicillins  Home Medications   Prior to Admission medications   Medication Sig Start Date End Date Taking? Authorizing Provider  albuterol (PROVENTIL HFA;VENTOLIN HFA) 108 (90 BASE) MCG/ACT inhaler Inhale into the lungs every 6 (six) hours as needed for wheezing or shortness of breath.   Yes Historical Provider, MD  aspirin 81 MG tablet Take 81 mg by mouth daily.   Yes Historical Provider, MD  cetirizine (ZYRTEC) 10 MG tablet Take 10 mg by mouth 2 (two) times daily.   Yes Historical Provider, MD  fluticasone (FLONASE) 50 MCG/ACT nasal spray Place 1 spray into both nostrils 2 times daily at 12 noon and 4 pm.   Yes Historical Provider, MD  hydrochlorothiazide (HYDRODIURIL) 12.5 MG tablet Take 12.5 mg by mouth daily.   Yes Historical Provider, MD  insulin glargine (LANTUS) 100 UNIT/ML injection Inject into the skin at bedtime.   Yes Historical Provider, MD  insulin lispro (HUMALOG) 100 UNIT/ML injection Inject into the skin 3 (three) times daily before meals.   Yes Historical Provider, MD  losartan (COZAAR) 25 MG tablet Take 25 mg by mouth daily.   Yes Historical Provider, MD  Melatonin 5 MG TABS Take by mouth.   Yes Historical Provider, MD  metFORMIN (GLUCOPHAGE) 500 MG tablet Take by mouth 2 (two) times daily with a meal.   Yes Historical Provider, MD  Multiple Vitamin (MULTIVITAMIN) tablet Take 1 tablet by mouth daily.   Yes Historical Provider, MD  pantoprazole (PROTONIX) 40 MG  tablet Take 40 mg by mouth 2 (two) times daily.   Yes Historical Provider, MD  prazosin (MINIPRESS) 5 MG capsule Take 5 mg by mouth at bedtime.   Yes Historical Provider, MD  traZODone (DESYREL) 100 MG tablet Take 200 mg by mouth 2 (two) times daily.   Yes Historical Provider, MD   Meds Ordered and Administered this Visit   Medications  ketorolac (TORADOL) injection 30 mg (30 mg Intramuscular Given 08/21/15 0949)    BP 132/90 mmHg  Pulse 70  Temp(Src) 98.6 F (37 C) (Tympanic)   Resp 18  Ht 6' (1.829 m)  Wt 270 lb (122.471 kg)  BMI 36.61 kg/m2  SpO2 96% No data found.   Physical Exam  Constitutional: He is oriented to person, place, and time. He appears well-developed and well-nourished.  HENT:  Head: Normocephalic.  Neck: Neck supple.  Musculoskeletal: Normal range of motion. He exhibits tenderness. He exhibits no edema.       Lumbar back: He exhibits tenderness and bony tenderness. He exhibits no spasm.       Back:  Patient is tender over the right iliac sacral area and point tenderness along the joint space.-Reports feeling improved with palpation over the area.  Neurological: He is alert and oriented to person, place, and time.  Skin: Skin is warm and dry. No rash noted. No erythema.  Psychiatric: He has a normal mood and affect.  Vitals reviewed.   ED Course  Procedures (including critical care time)  Labs Review Labs Reviewed  URINALYSIS COMPLETEWITH MICROSCOPIC [facility] ONLY) - Abnormal; Notable for the following:    Squamous Epithelial / LPF 0-5 (*)    All other components within normal limits    Imaging Review No results found.   Visual Acuity Review  Right Eye Distance:   Left Eye Distance:   Bilateral Distance:    Right Eye Near:   Left Eye Near:    Bilateral Near:      Results for orders placed or performed during the hospital encounter of 08/21/15  Urinalysis complete, with microscopic  Result Value Ref Range   Color, Urine YELLOW YELLOW   APPearance CLEAR CLEAR   Glucose, UA NEGATIVE NEGATIVE mg/dL   Bilirubin Urine NEGATIVE NEGATIVE   Ketones, ur NEGATIVE NEGATIVE mg/dL   Specific Gravity, Urine 1.015 1.005 - 1.030   Hgb urine dipstick NEGATIVE NEGATIVE   pH 7.0 5.0 - 8.0   Protein, ur NEGATIVE NEGATIVE mg/dL   Nitrite NEGATIVE NEGATIVE   Leukocytes, UA NEGATIVE NEGATIVE   RBC / HPF NONE SEEN 0 - 5 RBC/hpf   WBC, UA NONE SEEN 0 - 5 WBC/hpf   Bacteria, UA NONE SEEN NONE SEEN   Squamous Epithelial / LPF 0-5 (A)  NONE SEEN     MDM   1. Sacral back pain   2. SI (sacroiliac) joint inflammation ([place])     Patient reports back and she feels better after palpation over the area. Explained to him he may need to have a cortisone injection into the joint space made call a see orthopedic choice or if not better in about 2 weeks he can come back and we might be able to attempt injection in the area. Because of his age do not want him on anti-inflammatories long-term will place him on a short course of Mobic 7.5 one tablet a day. Will administer Toradol 30 mg here IM.  Urinalysis was negative culture was not ordered.    Frederich Cha, MD 08/21/15 [phone]

## 2015-08-21 NOTE — Discharge Instructions (Signed)
Sacroiliac Joint Dysfunction Sacroiliac joint dysfunction is a condition that causes inflammation on one or both sides of the sacroiliac (SI) joint. The SI joint connects the lower part of the spine (sacrum) with the two upper portions of the pelvis (ilium). This condition causes deep aching or burning pain in the low back. In some cases, the pain may also spread into one or both buttocks or hips or spread down the legs. CAUSES This condition may be caused by:  Pregnancy. During pregnancy, extra stress is put on the SI joints because the pelvis widens.  Injury, such as:  Car accidents.  Sport-related injuries.  Work-related injuries.  Having one leg that is shorter than the other.  Conditions that affect the joints, such as:  Rheumatoid arthritis.  Gout.  Psoriatic arthritis.  Joint infection (septic arthritis). Sometimes, the cause of SI joint dysfunction is not known. SYMPTOMS Symptoms of this condition include:  Aching or burning pain in the lower back. The pain may also spread to other areas, such as:  Buttocks.  Groin.  Thighs and legs.  Muscle spasms in or around the painful areas.  Increased pain when standing, walking, running, stair climbing, bending, or lifting. DIAGNOSIS Your health care provider will do a physical exam and take your medical history. During the exam, the health care provider may move one or both of your legs to different positions to check for pain. Various tests may be done to help verify the diagnosis, including:  Imaging tests to look for other causes of pain. These may include:  MRI.  CT scan.  Bone scan.  Diagnostic injection. A numbing medicine is injected into the SI joint using a needle. If the pain is temporarily improved or stopped after the injection, this can indicate that SI joint dysfunction is the problem. TREATMENT Treatment may vary depending on the cause and severity of your condition. Treatment options may  include:  Applying ice or heat to the lower back area. This can help to reduce pain and muscle spasms.  Medicines to relieve pain or inflammation or to relax the muscles.  Wearing a back brace (sacroiliac brace) to help support the joint while your back is healing.  Physical therapy to increase muscle strength around the joint and flexibility at the joint. This may also involve learning proper body positions and ways of moving to relieve stress on the joint.  Direct manipulation of the SI joint.  Injections of steroid medicine into the joint in order to reduce pain and swelling.  Radiofrequency ablation to burn away nerves that are carrying pain messages from the joint.  Use of a device that provides electrical stimulation in order to reduce pain at the joint.  Surgery to put in screws and plates that limit or prevent joint motion. This is rare. HOME CARE INSTRUCTIONS  Rest as needed. Limit your activities as directed by your health care provider.  Take medicines only as directed by your health care provider.  If directed, apply ice to the affected area:  Put ice in a plastic bag.  Place a towel between your skin and the bag.  Leave the ice on for 20 minutes, 2-3 times per day.  Use a heating pad or a moist heat pack as directed by your health care provider.  Exercise as directed by your health care provider or physical therapist.  Keep all follow-up visits as directed by your health care provider. This is important. SEEK MEDICAL CARE IF:  Your pain is not controlled   with medicine.  You have a fever.  You have increasingly severe pain. SEEK IMMEDIATE MEDICAL CARE IF:  You have weakness, numbness, or tingling in your legs or feet.  You lose control of your bladder or bowel.   This information is not intended to replace advice given to you by your health care provider. Make sure you discuss any questions you have with your health care provider.   Document Released:  11/19/2008 Document Revised: 01/07/2015 Document Reviewed: 04/30/2014 Elsevier Interactive Patient Education 2016 Elsevier Inc.  

## 2015-08-21 NOTE — ED Notes (Signed)
C/o intermittent right flank pain x 1 week. Woke this morning and "could hardly get out of bed". States pain is non radiating and urinary symptoms

## 2015-08-27 ENCOUNTER — Ambulatory Visit: Payer: Medicare Other

## 2015-08-27 ENCOUNTER — Encounter: Payer: Self-pay | Admitting: Emergency Medicine

## 2015-08-27 ENCOUNTER — Ambulatory Visit
Admission: EM | Admit: 2015-08-27 | Discharge: 2015-08-27 | Disposition: A | Discharge status: Home / Self Care | Payer: Medicare Other | Attending: Family Medicine | Admitting: Family Medicine

## 2015-08-27 DIAGNOSIS — Z880 Allergy status to penicillin: Secondary | ICD-10-CM | POA: Insufficient documentation

## 2015-08-27 DIAGNOSIS — Z79899 Other long term (current) drug therapy: Secondary | ICD-10-CM | POA: Insufficient documentation

## 2015-08-27 DIAGNOSIS — J449 Chronic obstructive pulmonary disease, unspecified: Secondary | ICD-10-CM | POA: Diagnosis not present

## 2015-08-27 DIAGNOSIS — F4310 Post-traumatic stress disorder, unspecified: Secondary | ICD-10-CM | POA: Diagnosis not present

## 2015-08-27 DIAGNOSIS — R0789 Other chest pain: Secondary | ICD-10-CM | POA: Diagnosis not present

## 2015-08-27 DIAGNOSIS — Z7982 Long term (current) use of aspirin: Secondary | ICD-10-CM | POA: Diagnosis not present

## 2015-08-27 DIAGNOSIS — Z87891 Personal history of nicotine dependence: Secondary | ICD-10-CM | POA: Diagnosis not present

## 2015-08-27 DIAGNOSIS — J40 Bronchitis, not specified as acute or chronic: Secondary | ICD-10-CM

## 2015-08-27 DIAGNOSIS — I10 Essential (primary) hypertension: Secondary | ICD-10-CM | POA: Diagnosis not present

## 2015-08-27 DIAGNOSIS — R079 Chest pain, unspecified: Secondary | ICD-10-CM | POA: Diagnosis not present

## 2015-08-27 DIAGNOSIS — Z794 Long term (current) use of insulin: Secondary | ICD-10-CM | POA: Insufficient documentation

## 2015-08-27 DIAGNOSIS — E7800 Pure hypercholesterolemia, unspecified: Secondary | ICD-10-CM | POA: Insufficient documentation

## 2015-08-27 DIAGNOSIS — E119 Type 2 diabetes mellitus without complications: Secondary | ICD-10-CM | POA: Insufficient documentation

## 2015-08-27 DIAGNOSIS — R05 Cough: Secondary | ICD-10-CM | POA: Diagnosis not present

## 2015-08-27 MED ORDER — HYDROCOD POLST-CPM POLST ER 10-8 MG/5ML PO SUER: 5.0000 mL | Freq: Two times a day (BID) | ORAL | Status: DC | PRN

## 2015-08-27 MED ORDER — AZITHROMYCIN 500 MG PO TABS: ORAL_TABLET | ORAL | Status: AC

## 2015-08-27 MED ORDER — PREDNISONE 10 MG (21) PO TBPK: ORAL_TABLET | ORAL | Status: DC

## 2015-08-27 NOTE — ED Notes (Signed)
Pt reports past few days with cough, can't sleep, difficulty breathing, chest pain. PMH heart problems .

## 2015-08-27 NOTE — Discharge Instructions (Signed)
Upper Respiratory Infection, Adult Most upper respiratory infections (URIs) are caused by a virus. A URI affects the nose, throat, and upper air passages. The most common type of URI is often called "the common cold." HOME CARE   Take medicines only as told by your doctor.  Gargle warm saltwater or take cough drops to comfort your throat as told by your doctor.  Use a warm mist humidifier or inhale steam from a shower to increase air moisture. This may make it easier to breathe.  Drink enough fluid to keep your pee (urine) clear or pale yellow.  Eat soups and other clear broths.  Have a healthy diet.  Rest as needed.  Go back to work when your fever is gone or your doctor says it is okay.  You may need to stay home longer to avoid giving your URI to others.  You can also wear a face mask and wash your hands often to prevent spread of the virus.  Use your inhaler more if you have asthma.  Do not use any tobacco products, including cigarettes, chewing tobacco, or electronic cigarettes. If you need help quitting, ask your doctor. GET HELP IF:  You are getting worse, not better.  Your symptoms are not helped by medicine.  You have chills.  You are getting more short of breath.  You have brown or red mucus.  You have yellow or brown discharge from your nose.  You have pain in your face, especially when you bend forward.  You have a fever.  You have puffy (swollen) neck glands.  You have pain while swallowing.  You have white areas in the back of your throat. GET HELP RIGHT AWAY IF:   You have very bad or constant:  Headache.  Ear pain.  Pain in your forehead, behind your eyes, and over your cheekbones (sinus pain).  Chest pain.  You have long-lasting (chronic) lung disease and any of the following:  Wheezing.  Long-lasting cough.  Coughing up blood.  A change in your usual mucus.  You have a stiff neck.  You have changes in  your:  Vision.  Hearing.  Thinking.  Mood. MAKE SURE YOU:   Understand these instructions.  Will watch your condition.  Will get help right away if you are not doing well or get worse.   This information is not intended to replace advice given to you by your health care provider. Make sure you discuss any questions you have with your health care provider.   Document Released: 02/09/2008 Document Revised: 01/07/2015 Document Reviewed: 11/28/2013 Elsevier Interactive Patient Education 2016 Elsevier Inc.  Nonspecific Chest Pain  Chest pain can be caused by many different conditions. There is always a chance that your pain could be related to something serious, such as a heart attack or a blood clot in your lungs. Chest pain can also be caused by conditions that are not life-threatening. If you have chest pain, it is very important to follow up with your health care provider. CAUSES  Chest pain can be caused by:  Heartburn.  Pneumonia or bronchitis.  Anxiety or stress.  Inflammation around your heart (pericarditis) or lung (pleuritis or pleurisy).  A blood clot in your lung.  A collapsed lung (pneumothorax). It can develop suddenly on its own (spontaneous pneumothorax) or from trauma to the chest.  Shingles infection (varicella-zoster virus).  Heart attack.  Damage to the bones, muscles, and cartilage that make up your chest wall. This can include:  Bruised  bones due to injury.  Strained muscles or cartilage due to frequent or repeated coughing or overwork.  Fracture to one or more ribs.  Sore cartilage due to inflammation (costochondritis). RISK FACTORS  Risk factors for chest pain may include:  Activities that increase your risk for trauma or injury to your chest.  Respiratory infections or conditions that cause frequent coughing.  Medical conditions or overeating that can cause heartburn.  Heart disease or family history of heart disease.  Conditions or  health behaviors that increase your risk of developing a blood clot.  Having had chicken pox (varicella zoster). SIGNS AND SYMPTOMS Chest pain can feel like:  Burning or tingling on the surface of your chest or deep in your chest.  Crushing, pressure, aching, or squeezing pain.  Dull or sharp pain that is worse when you move, cough, or take a deep breath.  Pain that is also felt in your back, neck, shoulder, or arm, or pain that spreads to any of these areas. Your chest pain may come and go, or it may stay constant. DIAGNOSIS Lab tests or other studies may be needed to find the cause of your pain. Your health care provider may have you take a test called an ambulatory ECG (electrocardiogram). An ECG records your heartbeat patterns at the time the test is performed. You may also have other tests, such as:  Transthoracic echocardiogram (TTE). During echocardiography, sound waves are used to create a picture of all of the heart structures and to look at how blood flows through your heart.  Transesophageal echocardiogram (TEE).This is a more advanced imaging test that obtains images from inside your body. It allows your health care provider to see your heart in finer detail.  Cardiac monitoring. This allows your health care provider to monitor your heart rate and rhythm in real time.  Holter monitor. This is a portable device that records your heartbeat and can help to diagnose abnormal heartbeats. It allows your health care provider to track your heart activity for several days, if needed.  Stress tests. These can be done through exercise or by taking medicine that makes your heart beat more quickly.  Blood tests.  Imaging tests. TREATMENT  Your treatment depends on what is causing your chest pain. Treatment may include:  Medicines. These may include:  Acid blockers for heartburn.  Anti-inflammatory medicine.  Pain medicine for inflammatory conditions.  Antibiotic medicine, if  an infection is present.  Medicines to dissolve blood clots.  Medicines to treat coronary artery disease.  Supportive care for conditions that do not require medicines. This may include:  Resting.  Applying heat or cold packs to injured areas.  Limiting activities until pain decreases. HOME CARE INSTRUCTIONS  If you were prescribed an antibiotic medicine, finish it all even if you start to feel better.  Avoid any activities that bring on chest pain.  Do not use any tobacco products, including cigarettes, chewing tobacco, or electronic cigarettes. If you need help quitting, ask your health care provider.  Do not drink alcohol.  Take medicines only as directed by your health care provider.  Keep all follow-up visits as directed by your health care provider. This is important. This includes any further testing if your chest pain does not go away.  If heartburn is the cause for your chest pain, you may be told to keep your head raised (elevated) while sleeping. This reduces the chance that acid will go from your stomach into your esophagus.  Make lifestyle changes  as directed by your health care provider. These may include:  Getting regular exercise. Ask your health care provider to suggest some activities that are safe for you.  Eating a heart-healthy diet. A registered dietitian can help you to learn healthy eating options.  Maintaining a healthy weight.  Managing diabetes, if necessary.  Reducing stress. SEEK MEDICAL CARE IF:  Your chest pain does not go away after treatment.  You have a rash with blisters on your chest.  You have a fever. SEEK IMMEDIATE MEDICAL CARE IF:   Your chest pain is worse.  You have an increasing cough, or you cough up blood.  You have severe abdominal pain.  You have severe weakness.  You faint.  You have chills.  You have sudden, unexplained chest discomfort.  You have sudden, unexplained discomfort in your arms, back, neck, or  jaw.  You have shortness of breath at any time.  You suddenly start to sweat, or your skin gets clammy.  You feel nauseous or you vomit.  You suddenly feel light-headed or dizzy.  Your heart begins to beat quickly, or it feels like it is skipping beats. These symptoms may represent a serious problem that is an emergency. Do not wait to see if the symptoms will go away. Get medical help right away. Call your local emergency services (911 in the [place] Do not drive yourself to the hospital.   This information is not intended to replace advice given to you by your health care provider. Make sure you discuss any questions you have with your health care provider.   Document Released: 06/02/2005 Document Revised: 09/13/2014 Document Reviewed: 03/29/2014 Elsevier Interactive Patient Education [employer].

## 2015-08-27 NOTE — ED Provider Notes (Signed)
CSN: [id number]     Arrival date & time 08/27/15  [id number] History   First MD Initiated Contact with Patient 08/27/15 0940    Nurses notes were reviewed. Chief Complaint  Patient presents with  . Chest Pain  . Cough   Individual reports chest pain from coughing. He was seen here about a week ago and diagnosed with sacroiliitis. He states he was coughing for several days before he was seen so the coughing is actually going on for about 2 weeks. He states that the sputum is becoming thicker yellow-green male keeping him up at night is having an inability to sleep and is having chest pains on both of his lower ribs in the back. He states the cough has gotten so bad twice now sleeping on the couch. He denies having a fever but does use an inhaler for bronchospasm he was a former smoker but stopped about 20 years ago. He has never had a diagnosis of COPD but he also states he goes to the [place] and they don't tell him anything. He is allergic to penicillin.    (Consider location/radiation/quality/duration/timing/severity/associated sxs/prior Treatment) Patient is a 69 y.o. male presenting with chest pain and cough. The history is provided by the patient and the spouse. No language interpreter was used.  Chest Pain Pain location:  L lateral chest and R lateral chest Pain quality: aching and shooting   Pain radiates to:  Does not radiate Pain severity:  Moderate Onset quality:  Sudden Progression:  Worsening Context: breathing   Context comment:  Coughing Relieved by:  Nothing Worsened by:  Coughing and deep breathing Associated symptoms: back pain, cough and shortness of breath   Associated symptoms: no anorexia, no anxiety, no diaphoresis, not vomiting and no weakness   Cough:    Cough characteristics:  Productive   Sputum characteristics:  Yellow and green   Severity:  Moderate   Duration:  2 weeks   Timing:  Constant   Progression:  Worsening   Chronicity:  New Risk factors: male sex and  obesity   Cough Associated symptoms: chest pain and shortness of breath   Associated symptoms: no diaphoresis     Past Medical History  Diagnosis Date  . Hypertension   . Diabetes mellitus without complication ([place])   . COPD (chronic obstructive pulmonary disease) ([place])   . Hypercholesteremia   . PTSD (post-traumatic stress disorder)   . Cancer [facility])    Past Surgical History  Procedure Laterality Date  . Abdominal surgery    . Cholecystectomy     Family History  Problem Relation Age of Onset  . Cancer Mother   . Lead poisoning Father    Social History  Substance Use Topics  . Smoking status: Former Research scientist (life sciences)  . Smokeless tobacco: None  . Alcohol Use: No    Review of Systems  Constitutional: Negative for diaphoresis.  Respiratory: Positive for cough and shortness of breath.   Cardiovascular: Positive for chest pain.  Gastrointestinal: Negative for vomiting and anorexia.  Musculoskeletal: Positive for back pain.  Neurological: Negative for weakness.  All other systems reviewed and are negative.   Allergies  Penicillins  Home Medications   Prior to Admission medications   Medication Sig Start Date End Date Taking? Authorizing Provider  albuterol (PROVENTIL HFA;VENTOLIN HFA) 108 (90 BASE) MCG/ACT inhaler Inhale into the lungs every 6 (six) hours as needed for wheezing or shortness of breath.    Historical Provider, MD  aspirin 81 MG tablet Take 81 mg  by mouth daily.    Historical Provider, MD  azithromycin (ZITHROMAX) 500 MG tablet 1 tablet daily for the next 5 days 08/27/15 09/01/15  Frederich Cha, MD  cetirizine (ZYRTEC) 10 MG tablet Take 10 mg by mouth 2 (two) times daily.    Historical Provider, MD  chlorpheniramine-HYDROcodone (TUSSIONEX PENNKINETIC ER) 10-8 MG/5ML SUER Take 5 mLs by mouth every 12 (twelve) hours as needed for cough. 08/27/15   Frederich Cha, MD  fluticasone (FLONASE) 50 MCG/ACT nasal spray Place 1 spray into both nostrils 2 times daily at 12 noon and  4 pm.    Historical Provider, MD  hydrochlorothiazide (HYDRODIURIL) 12.5 MG tablet Take 12.5 mg by mouth daily.    Historical Provider, MD  insulin glargine (LANTUS) 100 UNIT/ML injection Inject into the skin at bedtime.    Historical Provider, MD  insulin lispro (HUMALOG) 100 UNIT/ML injection Inject into the skin 3 (three) times daily before meals.    Historical Provider, MD  losartan (COZAAR) 25 MG tablet Take 25 mg by mouth daily.    Historical Provider, MD  Melatonin 5 MG TABS Take by mouth.    Historical Provider, MD  meloxicam (MOBIC) 7.5 MG tablet Take 1-2 tablets with food daily. 08/21/15   Frederich Cha, MD  metFORMIN (GLUCOPHAGE) 500 MG tablet Take by mouth 2 (two) times daily with a meal.    Historical Provider, MD  Multiple Vitamin (MULTIVITAMIN) tablet Take 1 tablet by mouth daily.    Historical Provider, MD  pantoprazole (PROTONIX) 40 MG tablet Take 40 mg by mouth 2 (two) times daily.    Historical Provider, MD  prazosin (MINIPRESS) 5 MG capsule Take 5 mg by mouth at bedtime.    Historical Provider, MD  predniSONE (STERAPRED UNI-PAK 21 TAB) 10 MG (21) TBPK tablet Sig 6 tablet day 1, 5 tablets day 2, 4 tablets day 3,,3tablets day 4, 2 tablets day 5, 1 tablet day 6 take all tablets orally 08/27/15   Frederich Cha, MD  traZODone (DESYREL) 100 MG tablet Take 200 mg by mouth 2 (two) times daily.    Historical Provider, MD   Meds Ordered and Administered this Visit  Medications - No data to display  BP 157/76 mmHg  Pulse 64  Temp(Src) 98 F (36.7 C) (Oral)  Resp 20  Ht 6' (1.829 m)  Wt 250 lb (113.399 kg)  BMI 33.90 kg/m2  SpO2 96% No data found.   Physical Exam  Constitutional: He is oriented to person, place, and time. He appears well-developed and well-nourished. No distress.  Obese white male  HENT:  Head: Normocephalic and atraumatic.  Right Ear: External ear normal.  Left Ear: External ear normal.  Mouth/Throat: Oropharynx is clear and moist.  Eyes: Pupils are equal,  round, and reactive to light.  Neck: Normal range of motion. Neck supple. No tracheal deviation present.  Cardiovascular: Normal rate and regular rhythm.  Exam reveals distant heart sounds.   Pulmonary/Chest: Breath sounds normal. He exhibits no mass and no tenderness.  Actively coughing  Abdominal: Soft.  Musculoskeletal: Normal range of motion.  Lymphadenopathy:    He has no cervical adenopathy.  Neurological: He is alert and oriented to person, place, and time.  Skin: Skin is warm. He is not diaphoretic. No erythema.  Psychiatric: He has a normal mood and affect.  Vitals reviewed.   ED Course  Procedures (including critical care time)  Labs Review Labs Reviewed - No data to display  Imaging Review Dg Chest 2 View  08/27/2015  CLINICAL DATA:  Cough and congestion for 3 months, history of prior tobacco use EXAM: CHEST - 2 VIEW COMPARISON:  10/24/2013 FINDINGS: Cardiac shadow is stable. Aortic calcifications are again noted. Lungs are hyperinflated consistent with COPD. No focal infiltrate or sizable effusion is noted. No acute bony abnormality is seen. IMPRESSION: Chronic changes without acute abnormality. Electronically Signed   By: Inez Catalina M.D.   On: 08/27/2015 11:01     Visual Acuity Review  Right Eye Distance:   Left Eye Distance:   Bilateral Distance:    Right Eye Near:   Left Eye Near:    Bilateral Near:      ED ECG REPORT   Date: 08/27/2015  EKG Time: 11:20 AM  Rate: 59  Rhythm: normal sinus rhythm and sinus bradycardia,  there are no previous tracings available for comparison, nonspecific ST and T waves changes, sinus bradycardia  Axis: 61  Intervals:none  ST&T Change: Nonspecific   Narrative Interpretation: Sinus bradycardia with nonspecific T-wave abnormalities             MDM   1. Bronchitis   2. Atypical chest pain       With patient reassurance that the chest pain is not like his cardiac pain but consistent with hurting when he coughs  will treat him for bronchitis. We'll place on Tussionex 60 course of prednisone continue using albuterol inhaler that he hasn't home with antibiotic be determined by his chest x-ray results.  EKG was reviewed and agree with the finding of sand sinus bradycardia with nonspecific T-wave abnormalities no acute changes present.  Chest x-ray shows some right lower lobe infiltrate marking. We'll wait for radiologist's review is far as if this is a pneumonia . Radiologist felt that the changing seen were chronic but did agree that patient has COPD which goes along prescribing some for him today.    Frederich Cha, MD 08/27/15 1120

## 2015-10-07 DIAGNOSIS — L209 Atopic dermatitis, unspecified: Secondary | ICD-10-CM | POA: Diagnosis not present

## 2015-10-07 DIAGNOSIS — L301 Dyshidrosis [pompholyx]: Secondary | ICD-10-CM | POA: Diagnosis not present

## 2015-10-29 DIAGNOSIS — D485 Neoplasm of uncertain behavior of skin: Secondary | ICD-10-CM | POA: Diagnosis not present

## 2015-10-29 DIAGNOSIS — L820 Inflamed seborrheic keratosis: Secondary | ICD-10-CM | POA: Diagnosis not present

## 2015-11-12 DIAGNOSIS — D2230 Melanocytic nevi of unspecified part of face: Secondary | ICD-10-CM | POA: Diagnosis not present

## 2015-12-26 DIAGNOSIS — I2510 Atherosclerotic heart disease of native coronary artery without angina pectoris: Secondary | ICD-10-CM | POA: Diagnosis not present

## 2015-12-26 DIAGNOSIS — E782 Mixed hyperlipidemia: Secondary | ICD-10-CM | POA: Diagnosis not present

## 2015-12-26 DIAGNOSIS — I10 Essential (primary) hypertension: Secondary | ICD-10-CM | POA: Diagnosis not present

## 2016-01-30 DIAGNOSIS — E782 Mixed hyperlipidemia: Secondary | ICD-10-CM | POA: Diagnosis not present

## 2016-01-30 DIAGNOSIS — G4733 Obstructive sleep apnea (adult) (pediatric): Secondary | ICD-10-CM | POA: Diagnosis not present

## 2016-01-30 DIAGNOSIS — I10 Essential (primary) hypertension: Secondary | ICD-10-CM | POA: Diagnosis not present

## 2016-06-06 ENCOUNTER — Encounter: Payer: Self-pay | Admitting: Gynecology

## 2016-06-06 ENCOUNTER — Ambulatory Visit
Admission: EM | Admit: 2016-06-06 | Discharge: 2016-06-06 | Disposition: A | Discharge status: Home / Self Care | Payer: Medicare Other | Attending: Family Medicine | Admitting: Family Medicine

## 2016-06-06 DIAGNOSIS — L03114 Cellulitis of left upper limb: Secondary | ICD-10-CM

## 2016-06-06 MED ORDER — DOXYCYCLINE HYCLATE 100 MG PO CAPS: 100.0000 mg | ORAL_CAPSULE | Freq: Two times a day (BID) | ORAL | 0 refills | Status: DC

## 2016-06-06 NOTE — ED Provider Notes (Signed)
[facility]    CSN: [id number] Arrival date & time: 06/06/16  0802     History   Chief Complaint Chief Complaint  Patient presents with  . Arm Injury    HPI Tim Dean is a 70 y.o. male.   HPI: Patient presents today with symptoms of redness and warmth to his left forearm. Patient states that he has had this for the last few weeks. He believes initially it started a kitten scratched him. He denies any lymphadenopathy, fever, chills, headache, chest pain, shortness of breath. He has not seen a doctor for this. He is diabetic but is unsure whether his sugars have been uncontrolled recently.  Past Medical History:  Diagnosis Date  . Cancer ([place])   . COPD (chronic obstructive pulmonary disease) ([place])   . Diabetes mellitus without complication ([place])   . Hypercholesteremia   . Hypertension   . PTSD (post-traumatic stress disorder)     There are no active problems to display for this patient.   Past Surgical History:  Procedure Laterality Date  . ABDOMINAL SURGERY    . CHOLECYSTECTOMY         Home Medications    Prior to Admission medications   Medication Sig Start Date End Date Taking? Authorizing Provider  albuterol (PROVENTIL HFA;VENTOLIN HFA) 108 (90 BASE) MCG/ACT inhaler Inhale into the lungs every 6 (six) hours as needed for wheezing or shortness of breath.   Yes Historical Provider, MD  aspirin 81 MG tablet Take 81 mg by mouth daily.   Yes Historical Provider, MD  fluticasone (FLONASE) 50 MCG/ACT nasal spray Place 1 spray into both nostrils 2 times daily at 12 noon and 4 pm.   Yes Historical Provider, MD  hydrochlorothiazide (HYDRODIURIL) 12.5 MG tablet Take 12.5 mg by mouth daily.   Yes Historical Provider, MD  insulin glargine (LANTUS) 100 UNIT/ML injection Inject into the skin at bedtime.   Yes Historical Provider, MD  insulin lispro (HUMALOG) 100 UNIT/ML injection Inject into the skin 3 (three) times daily before meals.   Yes Historical Provider,  MD  losartan (COZAAR) 25 MG tablet Take 25 mg by mouth daily.   Yes Historical Provider, MD  Melatonin 5 MG TABS Take by mouth.   Yes Historical Provider, MD  meloxicam (MOBIC) 7.5 MG tablet Take 1-2 tablets with food daily. 08/21/15  Yes Frederich Cha, MD  Multiple Vitamin (MULTIVITAMIN) tablet Take 1 tablet by mouth daily.   Yes Historical Provider, MD  pantoprazole (PROTONIX) 40 MG tablet Take 40 mg by mouth 2 (two) times daily.   Yes Historical Provider, MD  prazosin (MINIPRESS) 5 MG capsule Take 5 mg by mouth at bedtime.   Yes Historical Provider, MD  traZODone (DESYREL) 100 MG tablet Take 200 mg by mouth 2 (two) times daily.   Yes Historical Provider, MD  cetirizine (ZYRTEC) 10 MG tablet Take 10 mg by mouth 2 (two) times daily.    Historical Provider, MD  chlorpheniramine-HYDROcodone (TUSSIONEX PENNKINETIC ER) 10-8 MG/5ML SUER Take 5 mLs by mouth every 12 (twelve) hours as needed for cough. 08/27/15   Frederich Cha, MD  metFORMIN (GLUCOPHAGE) 500 MG tablet Take by mouth 2 (two) times daily with a meal.    Historical Provider, MD  predniSONE (STERAPRED UNI-PAK 21 TAB) 10 MG (21) TBPK tablet Sig 6 tablet day 1, 5 tablets day 2, 4 tablets day 3,,3tablets day 4, 2 tablets day 5, 1 tablet day 6 take all tablets orally 08/27/15   Frederich Cha, MD  Family History Family History  Problem Relation Age of Onset  . Cancer Mother   . Lead poisoning Father     Social History Social History  Substance Use Topics  . Smoking status: Former Research scientist (life sciences)  . Smokeless tobacco: Never Used  . Alcohol use No     Allergies   Penicillins   Review of Systems Review of Systems: Negative except mentioned above.   Physical Exam Triage Vital Signs ED Triage Vitals  Enc Vitals Group     BP 06/06/16 0820 137/76     Pulse Rate 06/06/16 0820 71     Resp 06/06/16 0820 16     Temp 06/06/16 0820 99.7 F (37.6 C)     Temp Source 06/06/16 0820 Oral     SpO2 06/06/16 0820 97 %     Weight 06/06/16 0821 255 lb  (115.7 kg)     Height 06/06/16 0821 6' (1.829 m)     Head Circumference --      Peak Flow --      Pain Score 06/06/16 0825 2     Pain Loc --      Pain Edu? --      Excl. in [place]? --    No data found.   Updated Vital Signs BP 137/76 (BP Location: Right Arm)   Pulse 71   Temp 99.7 F (37.6 C) (Oral)   Resp 16   Ht 6' (1.829 m)   Wt 255 lb (115.7 kg)   SpO2 97%   BMI 34.58 kg/m     Physical Exam:  GENERAL: NAD, not toxic appearing RESP: CTA B CARD: RRR SKIN: Approximately 10 cm x 10 cm area of erythema noted on left dorsal aspect of forearm, no fluctuance appreciated, no streaks, no discharge, no axillary lymphadenopathy appreciated NEURO: CN II-XII grossly intact    UC Treatments / Results  Labs (all labs ordered are listed, but only abnormal results are displayed) Labs Reviewed - No data to display  EKG  EKG Interpretation None       Radiology No results found.  Procedures Procedures (including critical care time)  Medications Ordered in UC Medications - No data to display   Initial Impression / Assessment and Plan / UC Course  I have reviewed the triage vital signs and the nursing notes.  Pertinent labs & imaging results that were available during my care of the patient were reviewed by me and considered in my medical decision making (see chart for details).  Clinical Course   A/P: Cellulitis left forearm - will put patient on doxycycline, keep area clean and dry, follow-up with primary care physician this week, if symptoms are worsening patient is to go to the ER as discussed. Patient addresses understanding of this. We'll monitor his blood sugars and temperature closely.  Final Clinical Impressions(s) / UC Diagnoses   Final diagnoses:  None    New Prescriptions New Prescriptions   No medications on file     Paulina Fusi, MD 06/06/16 [phone]

## 2016-06-06 NOTE — ED Triage Notes (Signed)
Per patient left arm redness/ swollen x couple days ago. Patient not sure if symptoms cause by his cat bite couple weeks ago.

## 2016-06-06 NOTE — Discharge Instructions (Signed)
Follow-up with primary care physician this week. If symptoms worsen patient is to go to the ER as discussed. Keep area clean and dry.

## 2016-06-08 ENCOUNTER — Telehealth: Payer: Self-pay | Admitting: Emergency Medicine

## 2016-06-08 NOTE — Telephone Encounter (Signed)
Tried calling patient- no answer. Message left for patient to call us back if his symptoms are not improving or if his symptoms are worsening such as increase in redness, swelling or fevers.

## 2016-08-02 DIAGNOSIS — I10 Essential (primary) hypertension: Secondary | ICD-10-CM | POA: Diagnosis not present

## 2016-08-02 DIAGNOSIS — I2510 Atherosclerotic heart disease of native coronary artery without angina pectoris: Secondary | ICD-10-CM | POA: Diagnosis not present

## 2016-08-05 DIAGNOSIS — I10 Essential (primary) hypertension: Secondary | ICD-10-CM | POA: Diagnosis not present

## 2016-08-05 DIAGNOSIS — E119 Type 2 diabetes mellitus without complications: Secondary | ICD-10-CM | POA: Diagnosis not present

## 2016-08-05 DIAGNOSIS — R42 Dizziness and giddiness: Secondary | ICD-10-CM | POA: Diagnosis not present

## 2016-08-05 DIAGNOSIS — Z8673 Personal history of transient ischemic attack (TIA), and cerebral infarction without residual deficits: Secondary | ICD-10-CM | POA: Diagnosis not present

## 2016-08-05 DIAGNOSIS — R0609 Other forms of dyspnea: Secondary | ICD-10-CM | POA: Diagnosis not present

## 2016-08-05 DIAGNOSIS — I2510 Atherosclerotic heart disease of native coronary artery without angina pectoris: Secondary | ICD-10-CM | POA: Diagnosis not present

## 2016-08-05 DIAGNOSIS — E785 Hyperlipidemia, unspecified: Secondary | ICD-10-CM | POA: Diagnosis not present

## 2016-08-05 DIAGNOSIS — J449 Chronic obstructive pulmonary disease, unspecified: Secondary | ICD-10-CM | POA: Diagnosis not present

## 2016-08-05 DIAGNOSIS — R531 Weakness: Secondary | ICD-10-CM | POA: Diagnosis not present

## 2016-08-05 DIAGNOSIS — I679 Cerebrovascular disease, unspecified: Secondary | ICD-10-CM | POA: Diagnosis not present

## 2016-08-05 DIAGNOSIS — Z85038 Personal history of other malignant neoplasm of large intestine: Secondary | ICD-10-CM | POA: Diagnosis not present

## 2016-08-05 DIAGNOSIS — G4733 Obstructive sleep apnea (adult) (pediatric): Secondary | ICD-10-CM | POA: Diagnosis not present

## 2016-08-05 DIAGNOSIS — F4310 Post-traumatic stress disorder, unspecified: Secondary | ICD-10-CM | POA: Diagnosis not present

## 2016-08-05 DIAGNOSIS — R05 Cough: Secondary | ICD-10-CM | POA: Diagnosis not present

## 2016-10-13 DIAGNOSIS — L209 Atopic dermatitis, unspecified: Secondary | ICD-10-CM | POA: Diagnosis not present

## 2017-09-02 DIAGNOSIS — I10 Essential (primary) hypertension: Secondary | ICD-10-CM | POA: Diagnosis not present

## 2017-09-02 DIAGNOSIS — I2510 Atherosclerotic heart disease of native coronary artery without angina pectoris: Secondary | ICD-10-CM | POA: Diagnosis not present

## 2017-09-09 DIAGNOSIS — I2510 Atherosclerotic heart disease of native coronary artery without angina pectoris: Secondary | ICD-10-CM | POA: Diagnosis not present

## 2017-09-09 DIAGNOSIS — G4733 Obstructive sleep apnea (adult) (pediatric): Secondary | ICD-10-CM | POA: Diagnosis not present

## 2017-09-09 DIAGNOSIS — Z9861 Coronary angioplasty status: Secondary | ICD-10-CM | POA: Diagnosis not present

## 2017-09-09 DIAGNOSIS — E669 Obesity, unspecified: Secondary | ICD-10-CM | POA: Diagnosis not present

## 2017-09-09 DIAGNOSIS — R202 Paresthesia of skin: Secondary | ICD-10-CM | POA: Diagnosis not present

## 2017-09-09 DIAGNOSIS — M542 Cervicalgia: Secondary | ICD-10-CM | POA: Diagnosis not present

## 2017-09-09 DIAGNOSIS — J449 Chronic obstructive pulmonary disease, unspecified: Secondary | ICD-10-CM | POA: Diagnosis not present

## 2017-10-10 DIAGNOSIS — I2510 Atherosclerotic heart disease of native coronary artery without angina pectoris: Secondary | ICD-10-CM | POA: Diagnosis not present

## 2017-11-04 ENCOUNTER — Ambulatory Visit
Admission: EM | Admit: 2017-11-04 | Discharge: 2017-11-04 | Disposition: A | Discharge status: Home / Self Care | Payer: Medicare Other | Attending: Family Medicine | Admitting: Family Medicine

## 2017-11-04 ENCOUNTER — Encounter: Payer: Self-pay | Admitting: *Deleted

## 2017-11-04 DIAGNOSIS — R9431 Abnormal electrocardiogram [ECG] [EKG]: Secondary | ICD-10-CM | POA: Diagnosis not present

## 2017-11-04 DIAGNOSIS — J441 Chronic obstructive pulmonary disease with (acute) exacerbation: Secondary | ICD-10-CM | POA: Diagnosis not present

## 2017-11-04 DIAGNOSIS — R079 Chest pain, unspecified: Secondary | ICD-10-CM | POA: Diagnosis not present

## 2017-11-04 DIAGNOSIS — E079 Disorder of thyroid, unspecified: Secondary | ICD-10-CM | POA: Diagnosis not present

## 2017-11-04 DIAGNOSIS — R0789 Other chest pain: Secondary | ICD-10-CM

## 2017-11-04 DIAGNOSIS — M79602 Pain in left arm: Secondary | ICD-10-CM | POA: Diagnosis not present

## 2017-11-04 MED ORDER — BENZONATATE 100 MG PO CAPS: 100.0000 mg | ORAL_CAPSULE | Freq: Three times a day (TID) | ORAL | 0 refills | Status: DC | PRN

## 2017-11-04 MED ORDER — PREDNISONE 20 MG PO TABS: 20.0000 mg | ORAL_TABLET | Freq: Every day | ORAL | 0 refills | Status: DC

## 2017-11-04 MED ORDER — DOXYCYCLINE HYCLATE 100 MG PO TABS: 100.0000 mg | ORAL_TABLET | Freq: Two times a day (BID) | ORAL | 0 refills | Status: DC

## 2017-11-04 NOTE — ED Triage Notes (Signed)
C/o of chest pain x 3 days. Pt has medical Hx of Heart attach in 1990 and a couple of "mini strokes". Pt states he is SOB, but thinks it is due to fluid over flow. 12 lead EKG done.

## 2017-11-04 NOTE — ED Provider Notes (Signed)
[facility]    CSN: [id number] Arrival date & time: 11/04/17  0845     History   Chief Complaint Chief Complaint  Patient presents with  . Chest Pain    HPI Tim Dean is a 72 y.o. male.   The history is provided by the patient.  Chest Pain  Associated symptoms: cough and fatigue   URI  Presenting symptoms: congestion, cough and fatigue   Severity:  Moderate Onset quality:  Sudden Duration:  2 weeks Timing:  Constant Progression:  Unchanged Chronicity:  New Relieved by:  None tried Ineffective treatments:  None tried Associated symptoms: no wheezing   Risk factors: being elderly, chronic cardiac disease (recent myocardial perfusion stress test about 4 weeks ago was negative with normal EF), chronic respiratory disease (COPD), diabetes mellitus and sick contacts (wife treated for bronchitis last week)   Risk factors: no immunosuppression, no recent illness and no recent travel     Past Medical History:  Diagnosis Date  . Cancer ([place])   . COPD (chronic obstructive pulmonary disease) ([place])   . Diabetes mellitus without complication ([place])   . Hypercholesteremia   . Hypertension   . PTSD (post-traumatic stress disorder)     There are no active problems to display for this patient.   Past Surgical History:  Procedure Laterality Date  . ABDOMINAL SURGERY    . CHOLECYSTECTOMY         Home Medications    Prior to Admission medications   Medication Sig Start Date End Date Taking? Authorizing Provider  albuterol (PROVENTIL HFA;VENTOLIN HFA) 108 (90 BASE) MCG/ACT inhaler Inhale into the lungs every 6 (six) hours as needed for wheezing or shortness of breath.   Yes [provider]  Ascorbic Acid (VITAMIN C) 100 MG tablet Take 100 mg by mouth daily.   Yes [provider]  aspirin 81 MG tablet Take 81 mg by mouth daily.   Yes [provider]  budesonide-formoterol (SYMBICORT) 160-4.5 MCG/ACT inhaler Inhale 2 puffs into the  lungs 2 (two) times daily.   Yes [provider]  cetirizine (ZYRTEC) 10 MG tablet Take 10 mg by mouth 2 (two) times daily.   Yes [provider]  cholecalciferol (VITAMIN D) 1000 units tablet Take 25 Units by mouth 2 (two) times daily.   Yes [provider]  cyanocobalamin 100 MCG tablet Take 100 mcg by mouth daily.   Yes [provider]  docusate sodium (COLACE) 50 MG capsule Take 50 mg by mouth 2 (two) times daily.   Yes [provider]  DULoxetine (CYMBALTA) 30 MG capsule Take 30 mg by mouth daily.   Yes [provider]  hydrochlorothiazide (HYDRODIURIL) 12.5 MG tablet Take 25 mg by mouth daily.    Yes [provider]  insulin glargine (LANTUS) 100 UNIT/ML injection Inject into the skin at bedtime.   Yes [provider]  insulin lispro (HUMALOG) 100 UNIT/ML injection Inject into the skin 3 (three) times daily before meals.   Yes [provider]  losartan (COZAAR) 25 MG tablet Take 25 mg by mouth daily.   Yes [provider]  metFORMIN (GLUCOPHAGE) 500 MG tablet Take by mouth 2 (two) times daily with a meal.   Yes [provider]  metoprolol succinate (TOPROL-XL) 25 MG 24 hr tablet Take 25 mg by mouth 2 (two) times daily.   Yes [provider]  pantoprazole (PROTONIX) 20 MG tablet Take 20 mg by mouth daily.   Yes [provider]  rosuvastatin (CRESTOR) 10 MG tablet Take 10 mg by mouth 2 (two) times daily.   Yes [provider]  tiotropium (SPIRIVA) 18 MCG inhalation capsule Place 18 mcg into inhaler and inhale daily.   Yes [provider]  trazodone (DESYREL) 300 MG tablet Take 300 mg by mouth at bedtime.   Yes [provider]  vitamin E 100 UNIT capsule Take by mouth daily.   Yes [provider]  benzonatate (TESSALON) 100 MG capsule Take 1 capsule (100 mg total) by mouth 3 (three) times daily as needed for cough. 11/04/17   Norval Gable, MD    cetirizine (ZYRTEC) 10 MG tablet Take 10 mg by mouth 2 (two) times daily.    [provider]  chlorpheniramine-HYDROcodone (TUSSIONEX PENNKINETIC ER) 10-8 MG/5ML SUER Take 5 mLs by mouth every 12 (twelve) hours as needed for cough. 08/27/15   Frederich Cha, MD  doxycycline (VIBRA-TABS) 100 MG tablet Take 1 tablet (100 mg total) by mouth 2 (two) times daily. 11/04/17   Norval Gable, MD  fluticasone (FLONASE) 50 MCG/ACT nasal spray Place 1 spray into both nostrils 2 times daily at 12 noon and 4 pm.    [provider]  Melatonin 5 MG TABS Take by mouth.    [provider]  meloxicam (MOBIC) 7.5 MG tablet Take 1-2 tablets with food daily. 08/21/15   Frederich Cha, MD  Multiple Vitamin (MULTIVITAMIN) tablet Take 1 tablet by mouth daily.    [provider]  pantoprazole (PROTONIX) 40 MG tablet Take 40 mg by mouth 2 (two) times daily.    [provider]  prazosin (MINIPRESS) 5 MG capsule Take 5 mg by mouth at bedtime.    [provider]  predniSONE (DELTASONE) 20 MG tablet Take 1 tablet (20 mg total) by mouth daily. 11/04/17   Norval Gable, MD  traZODone (DESYREL) 100 MG tablet Take 200 mg by mouth 2 (two) times daily.    [provider]    Family History Family History  Problem Relation Age of Onset  . Cancer Mother   . Lead poisoning Father     Social History Social History   Tobacco Use  . Smoking status: Former Research scientist (life sciences)  . Smokeless tobacco: Never Used  Substance Use Topics  . Alcohol use: No  . Drug use: Not on file     Allergies   Penicillins   Review of Systems Review of Systems  Constitutional: Positive for fatigue.  HENT: Positive for congestion.   Respiratory: Positive for cough. Negative for wheezing.   Cardiovascular: Positive for chest pain.     Physical Exam Triage Vital Signs ED Triage Vitals  Enc Vitals Group     BP 11/04/17 0912 (!) 152/76     Pulse Rate 11/04/17 0912 67     Resp 11/04/17 0912 16      Temp 11/04/17 0912 98.2 F (36.8 C)     Temp Source 11/04/17 0912 Oral     SpO2 11/04/17 0912 100 %     Weight 11/04/17 0914 255 lb (115.7 kg)     Height 11/04/17 0914 6' (1.829 m)     Head Circumference --      Peak Flow --      Pain Score 11/04/17 0914 2     Pain Loc --      Pain Edu? --      Excl. in [place]? --    No data found.  Updated Vital Signs BP (!) 152/76 (  BP Location: Right Arm)   Pulse 67   Temp 98.2 F (36.8 C) (Oral)   Resp 16   Ht 6' (1.829 m)   Wt 255 lb (115.7 kg)   SpO2 100%   BMI 34.58 kg/m   Visual Acuity Right Eye Distance:   Left Eye Distance:   Bilateral Distance:    Right Eye Near:   Left Eye Near:    Bilateral Near:     Physical Exam  Constitutional: He appears well-developed and well-nourished. No distress.  HENT:  Head: Normocephalic and atraumatic.  Nose: Nose normal.  Mouth/Throat: Uvula is midline, oropharynx is clear and moist and mucous membranes are normal. No oropharyngeal exudate or tonsillar abscesses. No tonsillar exudate.  Eyes: Conjunctivae are normal. Right eye exhibits no discharge. Left eye exhibits no discharge. No scleral icterus.  Neck: Normal range of motion. Neck supple. No tracheal deviation present. No thyromegaly present.  Cardiovascular: Normal rate, regular rhythm and normal heart sounds.  Pulmonary/Chest: Effort normal. No stridor. No respiratory distress. He has wheezes (few expiratory and rhonchi). He has no rales. He exhibits no tenderness.  Lymphadenopathy:    He has no cervical adenopathy.  Neurological: He is alert.  Skin: Skin is warm and dry. No rash noted. He is not diaphoretic.  Nursing note and vitals reviewed.    UC Treatments / Results  Labs (all labs ordered are listed, but only abnormal results are displayed) Labs Reviewed - No data to display  EKG  EKG Interpretation None       Radiology No results found.  Procedures ED EKG Date/Time: 11/04/2017 10:28 AM Performed by: Norval Gable, MD Authorized by: Norval Gable, MD   ECG reviewed by ED Physician in the absence of a cardiologist: yes   Previous ECG:    Previous ECG:  Compared to current   Similarity:  No change Interpretation:    Interpretation: normal   Rate:    ECG rate assessment: normal   Rhythm:    Rhythm: sinus rhythm   Ectopy:    Ectopy: none   QRS:    QRS axis:  Normal Conduction:    Conduction: normal   ST segments:    ST segments:  Normal T waves:    T waves: normal     (including critical care time)  Medications Ordered in UC Medications - No data to display   Initial Impression / Assessment and Plan / UC Course  I have reviewed the triage vital signs and the nursing notes.  Pertinent labs & imaging results that were available during my care of the patient were reviewed by me and considered in my medical decision making (see chart for details).       Final Clinical Impressions(s) / UC Diagnoses   Final diagnoses:  COPD exacerbation [facility])    ED Discharge Orders        Ordered    doxycycline (VIBRA-TABS) 100 MG tablet  2 times daily     11/04/17 0930    predniSONE (DELTASONE) 20 MG tablet  Daily     11/04/17 0930    benzonatate (TESSALON) 100 MG capsule  3 times daily PRN     11/04/17 0930     1. ekg results (negative; no changes) and diagnosis reviewed with patient 2. rx as per orders above; reviewed possible side effects, interactions, risks and benefits  3. Recommend continue current inhalers  4. Follow-up prn if symptoms worsen or don't improve  Controlled Substance Prescriptions [place] Controlled Substance  Registry consulted? Not Applicable   Norval Gable, MD 11/04/17 1100

## 2017-11-15 DIAGNOSIS — Z96652 Presence of left artificial knee joint: Secondary | ICD-10-CM | POA: Diagnosis not present

## 2017-11-15 DIAGNOSIS — M542 Cervicalgia: Secondary | ICD-10-CM | POA: Diagnosis not present

## 2017-11-15 DIAGNOSIS — S50811A Abrasion of right forearm, initial encounter: Secondary | ICD-10-CM | POA: Diagnosis not present

## 2017-11-15 DIAGNOSIS — M47812 Spondylosis without myelopathy or radiculopathy, cervical region: Secondary | ICD-10-CM | POA: Diagnosis not present

## 2017-11-15 DIAGNOSIS — R918 Other nonspecific abnormal finding of lung field: Secondary | ICD-10-CM | POA: Diagnosis not present

## 2017-11-15 DIAGNOSIS — S32049A Unspecified fracture of fourth lumbar vertebra, initial encounter for closed fracture: Secondary | ICD-10-CM | POA: Diagnosis not present

## 2017-11-15 DIAGNOSIS — M47815 Spondylosis without myelopathy or radiculopathy, thoracolumbar region: Secondary | ICD-10-CM | POA: Diagnosis not present

## 2017-11-15 DIAGNOSIS — Z87891 Personal history of nicotine dependence: Secondary | ICD-10-CM | POA: Diagnosis not present

## 2017-11-15 DIAGNOSIS — Z041 Encounter for examination and observation following transport accident: Secondary | ICD-10-CM | POA: Diagnosis not present

## 2017-11-15 DIAGNOSIS — I10 Essential (primary) hypertension: Secondary | ICD-10-CM | POA: Diagnosis not present

## 2017-11-15 DIAGNOSIS — M4312 Spondylolisthesis, cervical region: Secondary | ICD-10-CM | POA: Diagnosis not present

## 2017-11-15 DIAGNOSIS — R9431 Abnormal electrocardiogram [ECG] [EKG]: Secondary | ICD-10-CM | POA: Diagnosis not present

## 2017-11-15 DIAGNOSIS — M47816 Spondylosis without myelopathy or radiculopathy, lumbar region: Secondary | ICD-10-CM | POA: Diagnosis not present

## 2017-11-15 DIAGNOSIS — I517 Cardiomegaly: Secondary | ICD-10-CM | POA: Diagnosis not present

## 2017-11-15 DIAGNOSIS — R404 Transient alteration of awareness: Secondary | ICD-10-CM | POA: Diagnosis not present

## 2017-11-15 DIAGNOSIS — M25562 Pain in left knee: Secondary | ICD-10-CM | POA: Diagnosis not present

## 2017-11-15 DIAGNOSIS — S8992XA Unspecified injury of left lower leg, initial encounter: Secondary | ICD-10-CM | POA: Diagnosis not present

## 2017-11-15 DIAGNOSIS — M545 Low back pain: Secondary | ICD-10-CM | POA: Diagnosis not present

## 2017-11-15 DIAGNOSIS — I7090 Unspecified atherosclerosis: Secondary | ICD-10-CM | POA: Diagnosis not present

## 2017-11-15 DIAGNOSIS — R079 Chest pain, unspecified: Secondary | ICD-10-CM | POA: Diagnosis not present

## 2017-11-15 DIAGNOSIS — M47817 Spondylosis without myelopathy or radiculopathy, lumbosacral region: Secondary | ICD-10-CM | POA: Diagnosis not present

## 2017-11-17 ENCOUNTER — Encounter: Payer: Self-pay | Admitting: Emergency Medicine

## 2017-11-17 ENCOUNTER — Ambulatory Visit
Admission: EM | Admit: 2017-11-17 | Discharge: 2017-11-17 | Disposition: A | Discharge status: Home / Self Care | Payer: Medicare Other | Attending: Family Medicine | Admitting: Family Medicine

## 2017-11-17 ENCOUNTER — Other Ambulatory Visit: Payer: Self-pay

## 2017-11-17 DIAGNOSIS — M5489 Other dorsalgia: Secondary | ICD-10-CM

## 2017-11-17 DIAGNOSIS — M542 Cervicalgia: Secondary | ICD-10-CM

## 2017-11-17 DIAGNOSIS — R2232 Localized swelling, mass and lump, left upper limb: Secondary | ICD-10-CM | POA: Diagnosis not present

## 2017-11-17 DIAGNOSIS — M25532 Pain in left wrist: Secondary | ICD-10-CM | POA: Diagnosis not present

## 2017-11-17 MED ORDER — CYCLOBENZAPRINE HCL 10 MG PO TABS: 10.0000 mg | ORAL_TABLET | Freq: Three times a day (TID) | ORAL | 0 refills | Status: DC | PRN

## 2017-11-17 MED ORDER — HYDROCODONE-ACETAMINOPHEN 5-325 MG PO TABS: 1.0000 | ORAL_TABLET | Freq: Three times a day (TID) | ORAL | 0 refills | Status: DC | PRN
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
## 2017-11-17 NOTE — Discharge Instructions (Signed)
Rest.  Stop baclofen.  Flexeril and pain medication as needed.  Take care  Dr. Lacinda Axon
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
## 2018-03-03 ENCOUNTER — Other Ambulatory Visit: Payer: Self-pay | Admitting: Orthopedic Surgery

## 2018-03-03 ENCOUNTER — Ambulatory Visit
Admission: RE | Admit: 2018-03-03 | Discharge: 2018-03-03 | Disposition: A | Discharge status: Home / Self Care | Payer: No Typology Code available for payment source | Source: Ambulatory Visit | Attending: Orthopedic Surgery | Admitting: Orthopedic Surgery

## 2018-03-03 DIAGNOSIS — S63502D Unspecified sprain of left wrist, subsequent encounter: Secondary | ICD-10-CM | POA: Insufficient documentation

## 2018-03-03 DIAGNOSIS — X58XXXD Exposure to other specified factors, subsequent encounter: Secondary | ICD-10-CM | POA: Diagnosis not present

## 2018-03-03 DIAGNOSIS — S6392XA Sprain of unspecified part of left wrist and hand, initial encounter: Secondary | ICD-10-CM | POA: Diagnosis not present

## 2018-03-15 ENCOUNTER — Encounter: Payer: Self-pay | Admitting: Physical Therapy

## 2018-03-15 ENCOUNTER — Ambulatory Visit: Payer: No Typology Code available for payment source | Attending: Orthopedic Surgery | Admitting: Physical Therapy

## 2018-03-15 DIAGNOSIS — G8929 Other chronic pain: Secondary | ICD-10-CM | POA: Insufficient documentation

## 2018-03-15 DIAGNOSIS — R2689 Other abnormalities of gait and mobility: Secondary | ICD-10-CM | POA: Diagnosis not present

## 2018-03-15 DIAGNOSIS — M6281 Muscle weakness (generalized): Secondary | ICD-10-CM

## 2018-03-15 DIAGNOSIS — M5412 Radiculopathy, cervical region: Secondary | ICD-10-CM

## 2018-03-15 DIAGNOSIS — M25562 Pain in left knee: Secondary | ICD-10-CM | POA: Diagnosis not present

## 2018-03-15 DIAGNOSIS — M25532 Pain in left wrist: Secondary | ICD-10-CM | POA: Insufficient documentation
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
## 2018-03-22 ENCOUNTER — Ambulatory Visit: Payer: No Typology Code available for payment source | Admitting: Physical Therapy

## 2018-03-22 ENCOUNTER — Encounter: Payer: Self-pay | Admitting: Physical Therapy

## 2018-03-22 DIAGNOSIS — M25532 Pain in left wrist: Secondary | ICD-10-CM | POA: Diagnosis not present

## 2018-03-22 DIAGNOSIS — G8929 Other chronic pain: Secondary | ICD-10-CM | POA: Diagnosis not present

## 2018-03-22 DIAGNOSIS — M6281 Muscle weakness (generalized): Secondary | ICD-10-CM

## 2018-03-22 DIAGNOSIS — R2689 Other abnormalities of gait and mobility: Secondary | ICD-10-CM | POA: Diagnosis not present

## 2018-03-22 DIAGNOSIS — M5412 Radiculopathy, cervical region: Secondary | ICD-10-CM

## 2018-03-22 DIAGNOSIS — M25562 Pain in left knee: Secondary | ICD-10-CM | POA: Diagnosis not present

## 2018-03-22 NOTE — Therapy (Signed)
[facility] [address] [place], [place], [address] Phone: [phone]   Fax:  [phone]  Physical Therapy Treatment  Patient Details  Name: Tim Dean MRN: [id number] Date of Birth: [birth year] Referring Provider: Sylvester Harder, DO   Encounter Date: 03/22/2018  PT End of Session - 03/22/18 0953    Visit Number  2    Number of Visits  8    Date for PT Re-Evaluation  05/10/18    PT Start Time  0851    PT Stop Time  0943    PT Time Calculation (min)  52 min    Equipment Utilized During Treatment  Other (comment)    Activity Tolerance  Patient tolerated treatment well;Patient limited by pain       Past Medical History:  Diagnosis Date  . Cancer ([place])   . COPD (chronic obstructive pulmonary disease) ([place])   . Diabetes mellitus without complication ([place])   . Hypercholesteremia   . Hypertension   . PTSD (post-traumatic stress disorder)     Past Surgical History:  Procedure Laterality Date  . ABDOMINAL SURGERY    . CHOLECYSTECTOMY      There were no vitals filed for this visit.  Subjective Assessment - 03/22/18 0829    Subjective  Pt. has not been able to sleep due to pain in neck.  Pt. reports he was inactive due to COPD and not able to go outside.  Pt. reports pain in neck has gotten progressively worse and that wrist seems to be swollen.    Pertinent History  Pt. is [place] veteran and self-reports depression and PTSD.    Limitations  Walking;Standing;House hold activities    How long can you sit comfortably?  30 minutes    How long can you stand comfortably?  30 minutes    How long can you walk comfortably?  1 hour    Patient Stated Goals  Get rid of pain, "Get Healed", Be able to hunt again    Currently in Pain?  Yes    Pain Score  6     Pain Location  Wrist    Pain Orientation  Left    Pain Descriptors / Indicators  Sharp;Tingling    Pain Type  Chronic pain    Pain Onset  More than a month ago    Pain Frequency   Constant    Multiple Pain Sites  Yes    Pain Score  5    Pain Location  Knee    Pain Orientation  Left    Pain Descriptors / Indicators  Sharp    Pain Type  Chronic pain    Pain Onset  More than a month ago    Pain Frequency  Constant    Pain Score  8    Pain Location  Neck    Pain Orientation  Left    Pain Descriptors / Indicators  Sharp    Pain Type  Chronic pain    Pain Onset  More than a month ago    Pain Frequency  Constant    Pain Score  3    Pain Location  Back    Pain Orientation  Upper    Pain Descriptors / Indicators  Sharp    Pain Type  Chronic pain    Pain Onset  More than a month ago    Pain Frequency  Constant       Treatment:  Manual:  PROM to L UE  in all planes of movement (pt. reports increased pain with flex/ abd. but persisted on continuing) - 4 min PROM to L wrist (all WNL but with pain in flex) - 6 min Seated STM to paraspinals - 5 min Seated STM to B trap area - 5 min Supine STM to B trap area - 5 min Supine STM to cervical region - 5 min  Occipital release technique - 4 min Seated B SNAGs with towel and rotation - 4x30sec each side Seated SNAGs with towel and extension - 4x30sec Seated chin tucks and postural assessment/ correction - 3 min        PT Education - 03/22/18 0951    Education Details  Pt. educated on cervical snags with use of towel.    Person(s) Educated  Patient    Methods  Explanation;Demonstration;Verbal cues    Comprehension  Verbalized understanding;Returned demonstration          PT Long Term Goals - 03/16/18 0831      PT LONG TERM GOAL #1   Title  Pt. will increase FOTO to 51 to improve pain free mobility.    Baseline  FOTO: 40    Time  8    Period  Weeks    Status  New    Target Date  05/11/18      PT LONG TERM GOAL #2   Title  Pt. will improve 5x Sit-to-Stand to be within age-appropriate normative values (12.6 sec for 70-79 y.o.)     Baseline  5x STS: 48.88 sec    Time  8    Period  Weeks    Status   New    Target Date  05/11/18      PT LONG TERM GOAL #3   Title  Pt. will improve grip strength of R UE to be within 10% (63 lbs) of L UE in order to carry supplies when going hunting.    Baseline  R Grip Strength: 35 lbs    Time  8    Period  Weeks    Status  New    Target Date  05/11/18      PT LONG TERM GOAL #4   Title  Pt. will report <5/10 pain when sitting for >30 minutes in order to return to hunting in deer stand.    Baseline  Pt. unable to sit >30 minutes without 7/10 pain.    Time  8    Period  Weeks    Status  New    Target Date  05/11/18            Plan - 03/22/18 0957    Clinical Impression Statement  Pt. continues to be pain-focused during treatment not allowing for much movement in cervical region and spine.  Pt. responded well to manual therapy, specifically with STM in spinal region.   Pt. performed snags with towel in a seated position and demonstrated ability to perform with an increase in ROM of cervical rotation and extension.  Pt. educated on wrist splint being used while active with arm, and to not wear when resting.  Pt. will benefit from STM at next treatment and adding additional exercises with use of UEs.    Clinical Presentation  Evolving    Clinical Decision Making  Moderate    Rehab Potential  Fair    Clinical Impairments Affecting Rehab Potential  (-) time frame of injury; (-) pain focused    PT Frequency  1x / week  PT Duration  8 weeks    PT Treatment/Interventions  ADLs/Self Care Home Management;Cryotherapy;Electrical Stimulation;Moist Heat;Functional mobility Scientist, forensic;Therapeutic activities;Therapeutic exercise;Balance training;Neuromuscular re-education;Manual techniques;Passive range of motion;Dry needling    PT Next Visit Plan  Focus of Thoracic Rotation, Supine Exercises, Wrist Exercises.    PT Home Exercise Plan  Towel Snags for rotation and extension.       Patient will benefit from skilled therapeutic intervention in  order to improve the following deficits and impairments:  Abnormal gait, Decreased activity tolerance, Decreased endurance, Decreased range of motion, Decreased strength, Impaired perceived functional ability, Impaired UE functional use, Pain, Difficulty walking, Decreased mobility  Visit Diagnosis: Pain in left wrist  Chronic pain of left knee  Radiculopathy, cervical region  Muscle weakness (generalized)  Other abnormalities of gait and mobility     Problem List There are no active problems to display for this patient.  Pura Spice, PT, DPT # [id number] [place], SPT 03/22/2018, 5:12 PM  [facility] [address] [place], [place], [address] Phone: [phone]   Fax:  [phone]  Name: NASSIR NEIDERT MRN: [id number] Date of Birth: 12-28-1945

## 2018-03-29 ENCOUNTER — Ambulatory Visit: Payer: No Typology Code available for payment source

## 2018-03-29 DIAGNOSIS — M5412 Radiculopathy, cervical region: Secondary | ICD-10-CM | POA: Diagnosis not present

## 2018-03-29 DIAGNOSIS — M6281 Muscle weakness (generalized): Secondary | ICD-10-CM

## 2018-03-29 DIAGNOSIS — R2689 Other abnormalities of gait and mobility: Secondary | ICD-10-CM | POA: Diagnosis not present

## 2018-03-29 DIAGNOSIS — M25562 Pain in left knee: Secondary | ICD-10-CM

## 2018-03-29 DIAGNOSIS — M25532 Pain in left wrist: Secondary | ICD-10-CM | POA: Diagnosis not present

## 2018-03-29 DIAGNOSIS — G8929 Other chronic pain: Secondary | ICD-10-CM | POA: Diagnosis not present

## 2018-03-29 NOTE — Therapy (Cosign Needed)
[facility] [address] [place], [place], [address] Phone: [phone]   Fax:  [phone]  Physical Therapy Treatment  Patient Details  Name: Tim Dean MRN: [id number] Date of Birth: [birth year] Referring Provider: Sylvester Harder, DO   Encounter Date: 03/29/2018  PT End of Session - 03/29/18 0913    Visit Number  3    Number of Visits  8    Date for PT Re-Evaluation  05/10/18    PT Start Time  0900    PT Stop Time  0948    PT Time Calculation (min)  48 min    Equipment Utilized During Treatment  Other (comment)    Activity Tolerance  Patient tolerated treatment well;Patient limited by pain       Past Medical History:  Diagnosis Date  . Cancer ([place])   . COPD (chronic obstructive pulmonary disease) ([place])   . Diabetes mellitus without complication ([place])   . Hypercholesteremia   . Hypertension   . PTSD (post-traumatic stress disorder)     Past Surgical History:  Procedure Laterality Date  . ABDOMINAL SURGERY    . CHOLECYSTECTOMY      There were no vitals filed for this visit.  Subjective Assessment - 03/29/18 0901    Subjective  Pt. reports he has slept much better in the last couple of night.  Pt. also reports pain has decreased in neck, back, and wrist since last visit.  Back pain is still a concern and has been most painful for pt. since last visit.    Pertinent History  Pt. is [place] veteran and self-reports depression and PTSD.    Limitations  Walking;Standing;House hold activities    How long can you sit comfortably?  30 minutes    How long can you stand comfortably?  30 minutes    How long can you walk comfortably?  1 hour    Patient Stated Goals  Get rid of pain, "Get Healed", Be able to hunt again    Currently in Pain?  Yes    Pain Score  3     Pain Location  Wrist    Pain Orientation  Left    Pain Descriptors / Indicators  Sharp;Tingling    Pain Type  Chronic pain    Pain Onset  More than a month ago    Pain Frequency  Constant    Multiple Pain Sites  Yes    Pain Score  5    Pain Location  Knee    Pain Orientation  Left    Pain Descriptors / Indicators  Sharp    Pain Onset  More than a month ago    Pain Frequency  Constant    Pain Score  2    Pain Location  Neck    Pain Orientation  Left    Pain Descriptors / Indicators  Sharp    Pain Type  Chronic pain    Pain Onset  More than a month ago    Pain Score  3    Pain Location  Back    Pain Orientation  Upper    Pain Descriptors / Indicators  Sharp    Pain Type  Chronic pain    Pain Onset  More than a month ago    Pain Frequency  Constant          Treatment:  Manual:  Seated Dorsal-Palmar Glide at Radiocarpal Joint Grade II-III - 4 min with 30 sec bouts Seated  Mid-Carpal (and Radiocarpal) Distraction - (1 minute) (pt. Experience discomfort towards end of distraction, was not continued) Seated Mid-Carpal Dorsal-Palmar Glide - 4 min with 30 sec bouts Seated STM to paraspinals - 5 min Seated STM to B trap area - 5 min Supine STM to cervical region - 5 min  Occipital release technique - 3 min Seated B SNAGs with towel and rotation - 2x30sec each side Seated SNAGs with towel and extension - 2x30sec    There Ex:  Supine AAROM of B shoulders with cane - 2x15 with 4 second holds Shoulder adduction at Nautilus 40# x15  Pt. Attempted 95# originally but stated that it was too much weight after PT advised not to perform that much weight. PROM with active assist to L UE in all planes of movement - 4 min Seated chin tucks and postural assessment/ correction - 3 min    Moist Heat applied during STM to UT and cervical region and Cervical Stretches.            PT Long Term Goals - 03/16/18 0831      PT LONG TERM GOAL #1   Title  Pt. will increase FOTO to 51 to improve pain free mobility.    Baseline  FOTO: 40    Time  8    Period  Weeks    Status  New    Target Date  05/11/18      PT LONG TERM GOAL #2   Title   Pt. will improve 5x Sit-to-Stand to be within age-appropriate normative values (12.6 sec for 70-79 y.o.)     Baseline  5x STS: 48.88 sec    Time  8    Period  Weeks    Status  New    Target Date  05/11/18      PT LONG TERM GOAL #3   Title  Pt. will improve grip strength of R UE to be within 10% (63 lbs) of L UE in order to carry supplies when going hunting.    Baseline  R Grip Strength: 35 lbs    Time  8    Period  Weeks    Status  New    Target Date  05/11/18      PT LONG TERM GOAL #4   Title  Pt. will report <5/10 pain when sitting for >30 minutes in order to return to hunting in deer stand.    Baseline  Pt. unable to sit >30 minutes without 7/10 pain.    Time  8    Period  Weeks    Status  New    Target Date  05/11/18            Plan - 03/29/18 1124    Clinical Impression Statement  Pt. reports to be making progress in pain management in multple body parts.  Pt. reports and demonstrates compliance with HEP and is progressing to more strength based exercises.  Pt. will begin to transition to more tolerable strengthening exercises along with manual therapy for pain management.      Clinical Presentation  Evolving    Clinical Decision Making  Moderate    Rehab Potential  Fair    Clinical Impairments Affecting Rehab Potential  (-) time frame of injury; (-) pain focused    PT Frequency  1x / week    PT Duration  8 weeks    PT Treatment/Interventions  ADLs/Self Care Home Management;Cryotherapy;Electrical Stimulation;Moist Heat;Functional mobility Scientist, forensic;Therapeutic activities;Therapeutic exercise;Balance training;Neuromuscular re-education;Manual techniques;Passive  range of motion;Dry needling    PT Next Visit Plan  Possible strengthening exercises on Nautilus, and STM for back.    PT Home Exercise Plan  Towel Snags for rotation and extension.       Patient will benefit from skilled therapeutic intervention in order to improve the following deficits and  impairments:  Abnormal gait, Decreased activity tolerance, Decreased endurance, Decreased range of motion, Decreased strength, Impaired perceived functional ability, Impaired UE functional use, Pain, Difficulty walking, Decreased mobility  Visit Diagnosis: Pain in left wrist  Chronic pain of left knee  Radiculopathy, cervical region  Muscle weakness (generalized)  Other abnormalities of gait and mobility     Problem List There are no active problems to display for this patient.  This entire session was performed under direct supervision and direction of a licensed therapist/therapist assistant . I have personally read, edited and approve of the note as written.   Gwenlyn Saran Phillips Grout PT, DPT, GCS  Huprich,Jason 03/30/2018, 8:21 AM  [facility] [address] [place], [place], [address] Phone: [phone]   Fax:  [phone]  Name: VAL SCHIAVO MRN: [id number] Date of Birth: 19-Jul-1946

## 2018-03-31 ENCOUNTER — Other Ambulatory Visit
Admission: RE | Admit: 2018-03-31 | Discharge: 2018-03-31 | Disposition: A | Discharge status: Home / Self Care | Payer: Medicare Other | Source: Ambulatory Visit | Attending: Orthopedic Surgery | Admitting: Orthopedic Surgery

## 2018-03-31 DIAGNOSIS — M10042 Idiopathic gout, left hand: Secondary | ICD-10-CM | POA: Insufficient documentation

## 2018-03-31 LAB — COMPREHENSIVE METABOLIC PANEL
ALT: 17 U/L (ref 0–44)
ANION GAP: 8 (ref 5–15)
AST: 25 U/L (ref 15–41)
Albumin: 3.8 g/dL (ref 3.5–5.0)
Alkaline Phosphatase: 54 U/L (ref 38–126)
BUN: 13 mg/dL (ref 8–23)
CALCIUM: 8.8 mg/dL — AB (ref 8.9–10.3)
CHLORIDE: 101 mmol/L (ref 98–111)
CO2: 29 mmol/L (ref 22–32)
CREATININE: 1.12 mg/dL (ref 0.61–1.24)
Glucose, Bld: 230 mg/dL — ABNORMAL HIGH (ref 70–99)
Potassium: 3.7 mmol/L (ref 3.5–5.1)
Sodium: 138 mmol/L (ref 135–145)
Total Bilirubin: 0.8 mg/dL (ref 0.3–1.2)
Total Protein: 6.6 g/dL (ref 6.5–8.1)

## 2018-03-31 LAB — CBC
HCT: 43.5 % (ref 40.0–52.0)
Hemoglobin: 14.9 g/dL (ref 13.0–18.0)
MCH: 30 pg (ref 26.0–34.0)
MCHC: 34.1 g/dL (ref 32.0–36.0)
MCV: 87.9 fL (ref 80.0–100.0)
PLATELETS: 148 10*3/uL — AB (ref 150–440)
RBC: 4.95 MIL/uL (ref 4.40–5.90)
RDW: 14.1 % (ref 11.5–14.5)
WBC: 7.5 10*3/uL (ref 3.8–10.6)

## 2018-03-31 LAB — URIC ACID: URIC ACID, SERUM: 4.2 mg/dL (ref 3.7–8.6)

## 2018-04-05 ENCOUNTER — Encounter: Payer: Self-pay | Admitting: Physical Therapy

## 2018-04-05 ENCOUNTER — Ambulatory Visit: Payer: No Typology Code available for payment source | Admitting: Physical Therapy

## 2018-04-05 DIAGNOSIS — M25532 Pain in left wrist: Secondary | ICD-10-CM | POA: Diagnosis not present

## 2018-04-05 DIAGNOSIS — M6281 Muscle weakness (generalized): Secondary | ICD-10-CM | POA: Diagnosis not present

## 2018-04-05 DIAGNOSIS — R2689 Other abnormalities of gait and mobility: Secondary | ICD-10-CM | POA: Diagnosis not present

## 2018-04-05 DIAGNOSIS — M25562 Pain in left knee: Secondary | ICD-10-CM | POA: Diagnosis not present

## 2018-04-05 DIAGNOSIS — M5412 Radiculopathy, cervical region: Secondary | ICD-10-CM

## 2018-04-05 DIAGNOSIS — G8929 Other chronic pain: Secondary | ICD-10-CM | POA: Diagnosis not present

## 2018-04-05 NOTE — Therapy (Signed)
[facility] [address] [place], [place], [address] Phone: [phone]   Fax:  [phone]  Physical Therapy Treatment  Patient Details  Name: Tim Dean MRN: [id number] Date of Birth: October 30, 1945 Referring Provider: Sylvester Harder, DO   Encounter Date: 04/05/2018  PT End of Session - 04/05/18 0932    Visit Number  5    Number of Visits  8    Date for PT Re-Evaluation  05/10/18    PT Start Time  0900    PT Stop Time  0952    PT Time Calculation (min)  52 min    Equipment Utilized During Treatment  Other (comment)    Activity Tolerance  Patient tolerated treatment well;Patient limited by pain       Past Medical History:  Diagnosis Date  . Cancer ([place])   . COPD (chronic obstructive pulmonary disease) ([place])   . Diabetes mellitus without complication ([place])   . Hypercholesteremia   . Hypertension   . PTSD (post-traumatic stress disorder)     Past Surgical History:  Procedure Laterality Date  . ABDOMINAL SURGERY    . CHOLECYSTECTOMY      There were no vitals filed for this visit.  Subjective Assessment - 04/05/18 1342    Subjective  Pt. reports 8/10 pain in B chest after doing Nautilus exercise from last week.  Pt. reports back pain to be 3/10  Pt visited MD on 7/26 and reports MD suggested multplie bouts of PT per week, and pt. is agreeable as he wants to get stronger.    Pertinent History  Pt. is [place] veteran and self-reports depression and PTSD.    Limitations  Walking;Standing;House hold activities    How long can you sit comfortably?  30 minutes    How long can you stand comfortably?  30 minutes    How long can you walk comfortably?  1 hour    Patient Stated Goals  Get rid of pain, "Get Healed", Be able to hunt again    Currently in Pain?  Yes    Pain Score  3     Pain Location  Back    Pain Orientation  Upper    Pain Descriptors / Indicators  Sharp;Tingling    Pain Onset  More than a month ago    Pain Score  8     Pain Location  Chest    Pain Orientation  Right;Left    Pain Descriptors / Indicators  Sore    Pain Type  Acute pain    Pain Onset  In the past 7 days    Pain Frequency  Constant         Treatment   Manual:  Seated STM to paraspinals - 5 min Seated STM to B trap area - 9 min (added cervical AROM all planes).    There Ex:  Nustep L4 10 min. B UE/LE  Supine AAROM Flexion of B shoulders with cane - 2x15 with 4 second holds and slight overpressure Supine AAROM Chest Press B shoulders with cane - 2x15 with 4 second holds Supine Trunk Rotation with arms extended holding Yellow Ball 2x10 to B sides and 4 second holds Reassessed HEP/ progression with RTB (see new HEP).    Pt. Requires constant verbal cueing for proper breathing technique.          PT Long Term Goals - 03/16/18 0831      PT LONG TERM GOAL #1   Title  Pt. will increase FOTO to 51 to improve pain free mobility.    Baseline  FOTO: 40    Time  8    Period  Weeks    Status  New    Target Date  05/11/18      PT LONG TERM GOAL #2   Title  Pt. will improve 5x Sit-to-Stand to be within age-appropriate normative values (12.6 sec for 70-79 y.o.)     Baseline  5x STS: 48.88 sec    Time  8    Period  Weeks    Status  New    Target Date  05/11/18      PT LONG TERM GOAL #3   Title  Pt. will improve grip strength of R UE to be within 10% (63 lbs) of L UE in order to carry supplies when going hunting.    Baseline  R Grip Strength: 35 lbs    Time  8    Period  Weeks    Status  New    Target Date  05/11/18      PT LONG TERM GOAL #4   Title  Pt. will report <5/10 pain when sitting for >30 minutes in order to return to hunting in deer stand.    Baseline  Pt. unable to sit >30 minutes without 7/10 pain.    Time  8    Period  Weeks    Status  New    Target Date  05/11/18         Plan - 04/05/18 1632    Clinical Impression Statement  Pt. struggled with certain overhead movements in supine.  Pt.  has difficulty getting into supine position and struggles with ROM exercises with cane due to back and shoulder pain/ COPD.  Pt. did tolerate slight overpressure with forward flexion of B UEs, however noted pain towards end range.  Pt's. POC will be changed to 2x a week going forward to increase movement of pt. and decrease pain.    Clinical Presentation  Evolving    Clinical Decision Making  Moderate    Rehab Potential  Fair    Clinical Impairments Affecting Rehab Potential  (-) time frame of injury; (-) pain focused    PT Frequency  2x / week    PT Duration  8 weeks    PT Treatment/Interventions  ADLs/Self Care Home Management;Cryotherapy;Electrical Stimulation;Moist Heat;Functional mobility Scientist, forensic;Therapeutic activities;Therapeutic exercise;Balance training;Neuromuscular re-education;Manual techniques;Passive range of motion;Dry needling    PT Next Visit Plan  Movement exercises.    PT Home Exercise Plan  Towel Snags for rotation and extension.    Consulted and Agree with Plan of Care  Patient       Patient will benefit from skilled therapeutic intervention in order to improve the following deficits and impairments:  Abnormal gait, Decreased activity tolerance, Decreased endurance, Decreased range of motion, Decreased strength, Impaired perceived functional ability, Impaired UE functional use, Pain, Difficulty walking, Decreased mobility  Visit Diagnosis: Pain in left wrist  Chronic pain of left knee  Radiculopathy, cervical region  Muscle weakness (generalized)     Problem List There are no active problems to display for this patient.  Pura Spice, PT, DPT # [id number] [place], SPT 04/05/2018, 5:31 PM  [facility] [address] [place], [place], [address] Phone: [phone]   Fax:  [phone]  Name: Tim Dean MRN: [id number] Date of Birth: 1946-01-17

## 2018-04-05 NOTE — Patient Instructions (Signed)
Access Code: [email or web address]  URL: [email or web address]  Date: 04/05/2018  Prepared by: Dorcas Carrow   Exercises  Shoulder External Rotation and Scapular Retraction with Resistance - 15 reps - 2 sets - 1x daily - 3x weekly  Standing Tricep Extensions with Resistance - 15 reps - 2 sets - 1x daily - 3x weekly  Scapular Retraction with Resistance - 15 reps - 2 sets - 1x daily - 3x weekly  Supine Shoulder Flexion Extension AAROM with [place] - 20 reps - 1 sets - 1x daily - 7x weekly  Seated Thoracic Flexion and Rotation with Arms Crossed - 10 reps - 1 sets - 1x daily - 7x weekly

## 2018-04-11 ENCOUNTER — Encounter: Payer: Self-pay | Admitting: Physical Therapy

## 2018-04-11 ENCOUNTER — Ambulatory Visit: Payer: No Typology Code available for payment source | Attending: Orthopedic Surgery | Admitting: Physical Therapy

## 2018-04-11 DIAGNOSIS — R2689 Other abnormalities of gait and mobility: Secondary | ICD-10-CM | POA: Insufficient documentation

## 2018-04-11 DIAGNOSIS — M25532 Pain in left wrist: Secondary | ICD-10-CM | POA: Insufficient documentation

## 2018-04-11 DIAGNOSIS — M6281 Muscle weakness (generalized): Secondary | ICD-10-CM

## 2018-04-11 DIAGNOSIS — M25562 Pain in left knee: Secondary | ICD-10-CM | POA: Diagnosis not present

## 2018-04-11 DIAGNOSIS — G8929 Other chronic pain: Secondary | ICD-10-CM | POA: Diagnosis not present

## 2018-04-11 DIAGNOSIS — M5412 Radiculopathy, cervical region: Secondary | ICD-10-CM | POA: Diagnosis not present

## 2018-04-11 NOTE — Therapy (Signed)
[facility] [address] [place], [place], [address] Phone: [phone]   Fax:  [phone]  Physical Therapy Treatment  Patient Details  Name: Tim Dean MRN: [id number] Date of Birth: [birth year] Referring Provider: Sylvester Harder, DO   Encounter Date: 04/11/2018  PT End of Session - 04/11/18 1241    Visit Number  6    Number of Visits  8    Date for PT Re-Evaluation  05/10/18    PT Start Time  0936    PT Stop Time  1031    PT Time Calculation (min)  55 min    Equipment Utilized During Treatment  Other (comment)    Activity Tolerance  Patient tolerated treatment well;Patient limited by pain       Past Medical History:  Diagnosis Date  . Cancer ([place])   . COPD (chronic obstructive pulmonary disease) ([place])   . Diabetes mellitus without complication ([place])   . Hypercholesteremia   . Hypertension   . PTSD (post-traumatic stress disorder)     Past Surgical History:  Procedure Laterality Date  . ABDOMINAL SURGERY    . CHOLECYSTECTOMY      There were no vitals filed for this visit.  Subjective Assessment - 04/11/18 1003    Subjective  Pt. reports that he spent all day at [facility] yesterday and is pretty fatigued from the visit yesterday.  Pt. reports stomach discomfort from drinking lemonade yesterday.      Pertinent History  Pt. is [place] veteran and self-reports depression and PTSD.    Limitations  Walking;Standing;House hold activities    How long can you sit comfortably?  30 minutes    How long can you stand comfortably?  30 minutes    How long can you walk comfortably?  1 hour    Patient Stated Goals  Get rid of pain, "Get Healed", Be able to hunt again    Currently in Pain?  Yes    Pain Score  6     Pain Onset  More than a month ago    Pain Onset  In the past 7 days    Pain Onset  More than a month ago    Pain Onset  More than a month ago        Treatment   Manual:  Supine B UT/Cervical Stretches with added  AROM in all planes - 10 min Supine STM to Cervical Region - 4 min (trigger point release in UT)    There Ex:  Nustep L4 5 min. B UE/LE (not billed) Standing Scapular Retraction with RTB 2x15 Standing B ER with RTB 2x15 Standing B Shoulder Flexion with RTB 2x15 Standing B Tricep Extension with RTB 2x15      PT Long Term Goals - 03/16/18 0831      PT LONG TERM GOAL #1   Title  Pt. will increase FOTO to 51 to improve pain free mobility.    Baseline  FOTO: 40    Time  8    Period  Weeks    Status  New    Target Date  05/11/18      PT LONG TERM GOAL #2   Title  Pt. will improve 5x Sit-to-Stand to be within age-appropriate normative values (12.6 sec for 70-79 y.o.)     Baseline  5x STS: 48.88 sec    Time  8    Period  Weeks    Status  New    Target Date  05/11/18      PT LONG TERM GOAL #3   Title  Pt. will improve grip strength of R UE to be within 10% (63 lbs) of L UE in order to carry supplies when going hunting.    Baseline  R Grip Strength: 35 lbs    Time  8    Period  Weeks    Status  New    Target Date  05/11/18      PT LONG TERM GOAL #4   Title  Pt. will report <5/10 pain when sitting for >30 minutes in order to return to hunting in deer stand.    Baseline  Pt. unable to sit >30 minutes without 7/10 pain.    Time  8    Period  Weeks    Status  New    Target Date  05/11/18         Plan - 04/11/18 1242    Clinical Impression Statement  Pt. performed well with use of RTB for ther ex and is able perform more reps without an increase in pain.  Pt. will continue with more strength-based exercises in order to return to more functional activities at home without an increase in pain.    Clinical Presentation  Evolving    Clinical Decision Making  Moderate    Rehab Potential  Fair    Clinical Impairments Affecting Rehab Potential  (-) time frame of injury; (-) pain focused    PT Frequency  2x / week    PT Duration  8 weeks    PT Treatment/Interventions   ADLs/Self Care Home Management;Cryotherapy;Electrical Stimulation;Moist Heat;Functional mobility Scientist, forensic;Therapeutic activities;Therapeutic exercise;Balance training;Neuromuscular re-education;Manual techniques;Passive range of motion;Dry needling    PT Next Visit Plan  Movement exercises.    PT Home Exercise Plan  Pt. instructed to perform exercises with RTB.    Consulted and Agree with Plan of Care  Patient       Patient will benefit from skilled therapeutic intervention in order to improve the following deficits and impairments:  Abnormal gait, Decreased activity tolerance, Decreased endurance, Decreased range of motion, Decreased strength, Impaired perceived functional ability, Impaired UE functional use, Pain, Difficulty walking, Decreased mobility  Visit Diagnosis: Pain in left wrist  Chronic pain of left knee  Radiculopathy, cervical region  Muscle weakness (generalized)  Other abnormalities of gait and mobility     Problem List There are no active problems to display for this patient.  Pura Spice, PT, DPT # [id number] Gwenlyn Saran, SPT 04/11/2018, 12:46 PM   [facility] [address] [place], [place], [address] Phone: [phone]   Fax:  [phone]  Name: Tim Dean MRN: [id number] Date of Birth: September 05, 1946

## 2018-04-12 ENCOUNTER — Encounter: Payer: Medicare Other | Admitting: Physical Therapy

## 2018-04-13 ENCOUNTER — Encounter: Payer: Self-pay | Admitting: Physical Therapy

## 2018-04-13 ENCOUNTER — Ambulatory Visit: Payer: No Typology Code available for payment source | Admitting: Physical Therapy

## 2018-04-13 DIAGNOSIS — R2689 Other abnormalities of gait and mobility: Secondary | ICD-10-CM

## 2018-04-13 DIAGNOSIS — M6281 Muscle weakness (generalized): Secondary | ICD-10-CM

## 2018-04-13 DIAGNOSIS — M25562 Pain in left knee: Secondary | ICD-10-CM

## 2018-04-13 DIAGNOSIS — G8929 Other chronic pain: Secondary | ICD-10-CM

## 2018-04-13 DIAGNOSIS — M5412 Radiculopathy, cervical region: Secondary | ICD-10-CM

## 2018-04-13 DIAGNOSIS — M25532 Pain in left wrist: Secondary | ICD-10-CM | POA: Diagnosis not present

## 2018-04-13 NOTE — Therapy (Signed)
[facility] [address] [place], [place], [address] Phone: [phone]   Fax:  [phone]  Physical Therapy Treatment  Patient Details  Name: ASIEL CHROSTOWSKI MRN: [id number] Date of Birth: Feb 03, 1946 Referring Provider: Sylvester Harder, DO   Encounter Date: 04/13/2018  PT End of Session - 04/13/18 1331    Visit Number  7    Number of Visits  8    Date for PT Re-Evaluation  05/10/18    PT Start Time  0940    PT Stop Time  1033    PT Time Calculation (min)  53 min    Activity Tolerance  Patient tolerated treatment well;Patient limited by pain       Past Medical History:  Diagnosis Date  . Cancer ([place])   . COPD (chronic obstructive pulmonary disease) ([place])   . Diabetes mellitus without complication ([place])   . Hypercholesteremia   . Hypertension   . PTSD (post-traumatic stress disorder)     Past Surgical History:  Procedure Laterality Date  . ABDOMINAL SURGERY    . CHOLECYSTECTOMY      There were no vitals filed for this visit.  Subjective Assessment - 04/13/18 0952    Subjective  Pt. reports that neck was sore after last treatment.  Pt. reports he was unable to move much after manual therapy.  Pt. reports neck pain is more painful than any other body part at the moment.  Pt. reports soreness in scapular region.      Pertinent History  Pt. is [place] veteran and self-reports depression and PTSD.    Limitations  Walking;Standing;House hold activities    How long can you sit comfortably?  30 minutes    How long can you stand comfortably?  30 minutes    How long can you walk comfortably?  1 hour    Patient Stated Goals  Get rid of pain, "Get Healed", Be able to hunt again    Currently in Pain?  Yes    Pain Score  4     Pain Location  Neck    Pain Orientation  Upper    Pain Onset  More than a month ago    Pain Onset  In the past 7 days    Pain Onset  More than a month ago    Pain Onset  More than a month ago           Treatment:  There Ex:  Nautilus:  30# Lat Pull Down x15 (pt unable to complete second set due to pain in scapular area) 30# Tricep Ext. 2x15 30# Scapular Retraction 2x15 30# Chest Press 2x15 30# B sh. Adduction 2x15 20# Internal Rotation 2x15 20# External Rotation 2x15    Manual:  Prone STM to paraspinals in Thoracic region (10 min) Prone Grade I Unilateral PA's to T1-T6 30 sec bouts (8 min)  Pt. Reported tenderness in area with light touch, so no more than Grade I was applied.         Plan - 04/13/18 1119    Clinical Impression Statement  Pt. remains pain focused with movements and reports pain around T3-T5 after last treatment session with cervical stretches.  Pt. performs exercises with proper form and reports no pain with movement, however struggles with light touch to back and spine.  Pt. remains pain focused/ guarded with very light grade I mobs./ STM in prone position.       Clinical Presentation  Evolving  Rehab Potential  Fair    Clinical Impairments Affecting Rehab Potential  (-) time frame of injury; (-) pain focused    PT Frequency  2x / week    PT Duration  8 weeks    PT Treatment/Interventions  ADLs/Self Care Home Management;Cryotherapy;Electrical Stimulation;Moist Heat;Functional mobility Scientist, forensic;Therapeutic activities;Therapeutic exercise;Balance training;Neuromuscular re-education;Manual techniques;Passive range of motion;Dry needling    PT Next Visit Plan  Movement exercises.  RECERT     PT Home Exercise Plan  Pt. instructed to perform exercises with RTB.    Consulted and Agree with Plan of Care  Patient       Patient will benefit from skilled therapeutic intervention in order to improve the following deficits and impairments:  Abnormal gait, Decreased activity tolerance, Decreased endurance, Decreased range of motion, Decreased strength, Impaired perceived functional ability, Impaired UE functional use, Pain, Difficulty  walking, Decreased mobility  Visit Diagnosis: Pain in left wrist  Chronic pain of left knee  Radiculopathy, cervical region  Muscle weakness (generalized)  Other abnormalities of gait and mobility     Problem List There are no active problems to display for this patient.  Pura Spice, PT, DPT # [id number] [place], SPT 04/13/2018, 1:33 PM  [facility] [address] [place], [place], [address] Phone: [phone]   Fax:  [phone]  Name: DAVIEN MALONE MRN: [id number] Date of Birth: 27-Jul-1946

## 2018-04-18 ENCOUNTER — Encounter: Payer: Self-pay | Admitting: Physical Therapy

## 2018-04-18 ENCOUNTER — Ambulatory Visit: Payer: No Typology Code available for payment source | Admitting: Physical Therapy

## 2018-04-18 ENCOUNTER — Encounter: Payer: Medicare Other | Admitting: Physical Therapy

## 2018-04-18 DIAGNOSIS — M6281 Muscle weakness (generalized): Secondary | ICD-10-CM

## 2018-04-18 DIAGNOSIS — R2689 Other abnormalities of gait and mobility: Secondary | ICD-10-CM | POA: Diagnosis not present

## 2018-04-18 DIAGNOSIS — G8929 Other chronic pain: Secondary | ICD-10-CM | POA: Diagnosis not present

## 2018-04-18 DIAGNOSIS — M25562 Pain in left knee: Secondary | ICD-10-CM

## 2018-04-18 DIAGNOSIS — M25532 Pain in left wrist: Secondary | ICD-10-CM

## 2018-04-18 DIAGNOSIS — M5412 Radiculopathy, cervical region: Secondary | ICD-10-CM

## 2018-04-18 NOTE — Therapy (Signed)
[place] [facility] [facility] [address] [place], [place], [address] Phone: [phone]   Fax:  [phone]  Physical Therapy Treatment  Patient Details  Name: Tim Dean MRN: [id number] Date of Birth: 1945-09-24 Referring Provider: Sylvester Harder, DO   Encounter Date: 04/18/2018  PT End of Session - 04/18/18 1543    Visit Number  8    Number of Visits  8    Date for PT Re-Evaluation  05/10/18    PT Start Time  1532    PT Stop Time  1626    PT Time Calculation (min)  54 min    Activity Tolerance  Patient tolerated treatment well;Patient limited by pain       Past Medical History:  Diagnosis Date  . Cancer ([place])   . COPD (chronic obstructive pulmonary disease) ([place])   . Diabetes mellitus without complication ([place])   . Hypercholesteremia   . Hypertension   . PTSD (post-traumatic stress disorder)     Past Surgical History:  Procedure Laterality Date  . ABDOMINAL SURGERY    . CHOLECYSTECTOMY      There were no vitals filed for this visit.  Subjective Assessment - 04/18/18 1537    Subjective  Pt. reports he has not had a good day due to his wife "nagging" him at home.  Pt. has no wrist brace or AD upon walking to the clinic.  Pt. is walking more upright upon entering with more normalized gait pattern.    Pertinent History  Pt. is [place] veteran and self-reports depression and PTSD.    Limitations  Walking;Standing;House hold activities    How long can you sit comfortably?  30 minutes    How long can you stand comfortably?  30 minutes    How long can you walk comfortably?  1 hour    Patient Stated Goals  Get rid of pain, "Get Healed", Be able to hunt again    Currently in Pain?  Yes    Pain Score  6     Pain Location  Wrist    Pain Orientation  Left    Pain Descriptors / Indicators  Aching    Pain Type  Chronic pain    Pain Onset  More than a month ago    Pain Score  4    Pain Location  Back    Pain Orientation  Right;Left    Pain  Type  Acute pain;Chronic pain    Pain Onset  In the past 7 days    Pain Onset  More than a month ago    Pain Onset  More than a month ago        Treatment:  There Ex:  Nautilus:  30# Lat Pull Down 2x15 40# Tricep Ext. 2x15 40# Scapular Retraction 2x15 40# Chest Press 2x15 30# B sh. Adduction 2x15  Pt. Attempted 40# but was too much and caused pain in Upper Back 20# Internal Rotation 2x15 20# External Rotation 2x15  Standing Biceps Curl  With 8# dumbbell (with Brachioradialis compensation) 2x15 Seated Eccentric Wrist Supination/Pronation with R Therabar 2x15  NuStep 10+ min with 588 steps (not billed/warm-up)      PT Long Term Goals - 03/16/18 0831      PT LONG TERM GOAL #1   Title  Pt. will increase FOTO to 51 to improve pain free mobility.    Baseline  FOTO: 40    Time  8    Period  Weeks  Status  New    Target Date  05/11/18      PT LONG TERM GOAL #2   Title  Pt. will improve 5x Sit-to-Stand to be within age-appropriate normative values (12.6 sec for 70-79 y.o.)     Baseline  5x STS: 48.88 sec    Time  8    Period  Weeks    Status  New    Target Date  05/11/18      PT LONG TERM GOAL #3   Title  Pt. will improve grip strength of R UE to be within 10% (63 lbs) of L UE in order to carry supplies when going hunting.    Baseline  R Grip Strength: 35 lbs    Time  8    Period  Weeks    Status  New    Target Date  05/11/18      PT LONG TERM GOAL #4   Title  Pt. will report <5/10 pain when sitting for >30 minutes in order to return to hunting in deer stand.    Baseline  Pt. unable to sit >30 minutes without 7/10 pain.    Time  8    Period  Weeks    Status  New    Target Date  05/11/18            Plan - 04/18/18 1543    Clinical Impression Statement  Pt. was able to tolerate an increase in weight for most of the standing exercises at the Nautilus machine.  Pt. consistently requests to add weight to exercises, however is educated on proper  performance without an increase in pain.  Pt. disagrees with SPT however performs exercises begrudgingly.  Pt. performed exercises with wrist without much increase in pain.  Pt. will be reassessed at next visit for goals and for updated POC.    Clinical Presentation  Evolving    Clinical Decision Making  Moderate    Rehab Potential  Fair    Clinical Impairments Affecting Rehab Potential  (-) time frame of injury; (-) pain focused    PT Frequency  2x / week    PT Duration  8 weeks    PT Treatment/Interventions  ADLs/Self Care Home Management;Cryotherapy;Electrical Stimulation;Moist Heat;Functional mobility Scientist, forensic;Therapeutic activities;Therapeutic exercise;Balance training;Neuromuscular re-education;Manual techniques;Passive range of motion;Dry needling    PT Next Visit Plan  Update POC with goal reassessment.    PT Home Exercise Plan  Pt. instructed to perform exercises with RTB.    Consulted and Agree with Plan of Care  Patient       Patient will benefit from skilled therapeutic intervention in order to improve the following deficits and impairments:  Abnormal gait, Decreased activity tolerance, Decreased endurance, Decreased range of motion, Decreased strength, Impaired perceived functional ability, Impaired UE functional use, Pain, Difficulty walking, Decreased mobility  Visit Diagnosis: Pain in left wrist  Chronic pain of left knee  Radiculopathy, cervical region  Muscle weakness (generalized)  Other abnormalities of gait and mobility     Problem List There are no active problems to display for this patient.  Pura Spice, PT, DPT # [id number] Jessie Nation, SPT 04/19/2018, 1:15 PM   [facility] [address] [place], [place], [address] Phone: [phone]   Fax:  [phone]  Name: Tim Dean MRN: [id number] Date of Birth: 1945/11/15

## 2018-04-20 ENCOUNTER — Encounter: Payer: Medicare Other | Admitting: Physical Therapy

## 2018-04-25 ENCOUNTER — Ambulatory Visit: Payer: No Typology Code available for payment source | Admitting: Physical Therapy

## 2018-04-25 VITALS — BP 144/73 | HR 87

## 2018-04-25 DIAGNOSIS — M25532 Pain in left wrist: Secondary | ICD-10-CM | POA: Diagnosis not present

## 2018-04-25 DIAGNOSIS — G8929 Other chronic pain: Secondary | ICD-10-CM

## 2018-04-25 DIAGNOSIS — M5412 Radiculopathy, cervical region: Secondary | ICD-10-CM

## 2018-04-25 DIAGNOSIS — M25562 Pain in left knee: Secondary | ICD-10-CM | POA: Diagnosis not present

## 2018-04-25 DIAGNOSIS — R2689 Other abnormalities of gait and mobility: Secondary | ICD-10-CM

## 2018-04-25 DIAGNOSIS — M6281 Muscle weakness (generalized): Secondary | ICD-10-CM | POA: Diagnosis not present

## 2018-04-25 NOTE — Therapy (Signed)
[facility] [address] [place], [place], [address] Phone: [phone]   Fax:  [phone]  Physical Therapy Treatment  Patient Details  Name: Tim Dean MRN: [id number] Date of Birth: 29-Apr-1946 Referring Provider: Sylvester Harder, DO   Encounter Date: 04/25/2018  PT End of Session - 04/25/18 0953    Visit Number  9    Number of Visits  17    Date for PT Re-Evaluation  05/23/18    PT Start Time  0937    PT Stop Time  1033    PT Time Calculation (min)  56 min    Activity Tolerance  Patient tolerated treatment well;Patient limited by pain       Past Medical History:  Diagnosis Date  . Cancer ([place])   . COPD (chronic obstructive pulmonary disease) ([place])   . Diabetes mellitus without complication ([place])   . Hypercholesteremia   . Hypertension   . PTSD (post-traumatic stress disorder)     Past Surgical History:  Procedure Laterality Date  . ABDOMINAL SURGERY    . CHOLECYSTECTOMY      Vitals:   04/25/18 1014  BP: (!) 144/73  Pulse: 87  SpO2: 95%    Subjective Assessment - 04/25/18 1019    Subjective  Pt. very difficult to communicate with this AM due to pt. being very short in responses.  Pt. eventually opened up over the session and reports that he is likely headed towards divorce and that they have been fighting frequently lately which is causing him to be more aggravated and negative than usual.  Pt. is wearing L wrist brace and carrying SPC while in therapy this AM.      Pertinent History  Pt. is [place] veteran and self-reports depression and PTSD.    Limitations  Walking;Standing;House hold activities    How long can you sit comfortably?  30 minutes    How long can you stand comfortably?  30 minutes    How long can you walk comfortably?  1 hour    Patient Stated Goals  Get rid of pain, "Get Healed", Be able to hunt again    Currently in Pain?  Yes    Pain Score  6    4/10 after sitting for 5 minutes.   Pain  Location  Neck    Pain Orientation  Posterior    Pain Descriptors / Indicators  Aching    Pain Type  Chronic pain    Pain Onset  More than a month ago    Pain Frequency  Constant    Pain Score  4    Pain Location  Shoulder    Pain Orientation  Right;Left    Pain Descriptors / Indicators  Sore    Pain Type  Acute pain;Chronic pain    Pain Onset  In the past 7 days    Pain Frequency  Constant    Pain Score  4    Pain Location  Wrist    Pain Orientation  Left    Pain Descriptors / Indicators  Aching    Pain Onset  More than a month ago    Pain Frequency  Constant    Pain Score  4    Pain Location  Back    Pain Orientation  Upper    Pain Descriptors / Indicators  Sharp    Pain Type  Chronic pain    Pain Onset  More than a month ago    Pain  Frequency  Constant         [facility] PT Assessment - 04/26/18 0001      Assessment   Medical Diagnosis  Cervical radiculopathy/ Neck and Low back pain/ L CMC pain.      Referring Provider  Sylvester Harder, DO    Onset Date/Surgical Date  11/16/17      Prior Function   Level of Independence  Independent      Treatment:  There Ex:  Nautilus: 30# Lat Pull Down 2x15 40# Tricep Ext.2x15 40# Scapular Retraction2x15 40# Chest Press2x15 30# B sh. Adduction2x15   Standing Biceps Curl  With 8# dumbbell (with Brachioradialis compensation) 2x15   NuStep 10+ min (not billed/warm-up)   Goal Reassessment was performed and are noted below.        PT Long Term Goals - 04/25/18 0957      PT LONG TERM GOAL #1   Title  Pt. will increase FOTO to 51 to improve pain free mobility.    Baseline  FOTO: 40; 8/20 FOTO: 44    Time  8    Period  Weeks    Status  Partially Met    Target Date  05/11/18      PT LONG TERM GOAL #2   Title  Pt. will improve 5x Sit-to-Stand to be within age-appropriate normative values (12.6 sec for 70-79 y.o.)     Baseline  5x STS: 48.88 sec; 8/20 5x STS: 30 sec    Time  8    Period  Weeks    Status   Partially Met    Target Date  05/11/18      PT LONG TERM GOAL #3   Title  Pt. will improve grip strength of R UE to be within 10% (63 lbs) of L UE in order to carry supplies when going hunting.    Baseline  L Grip Strength: 35 lbs; 8/20 - L Grip Strength: 41 lbs     Time  8    Period  Weeks    Status  Partially Met    Target Date  05/11/18      PT LONG TERM GOAL #4   Title  Pt. will report <5/10 pain when sitting for >30 minutes in order to return to hunting in deer stand.    Baseline  Pt. unable to sit >30 minutes without 7/10 pain; Pt. reports 5/10 pain when in standing, 4/10 after sitting for a short time.    Time  8    Period  Weeks    Status  Partially Met    Target Date  05/11/18            Plan - 04/25/18 1026    Clinical Impression Statement  Pt. required consistent motivation today in order to perform ther ex.  Pt. was agitated and very short with answers today, however opened up after being educated on progress made while coming to therapy.  Pt. performed well on the Nautilus and did well with goal reassessment, making progress on all goals, however not meeting them yet.  Pt. continues to be pain focused when attending therapy, however has made progress towards goals.  Pt. educated on the importance of movement outside of clinic and the benefits of regular exercise in order to alleviate some of the joint stiffness that pt. demonstrates.  Pt. will continue to benefit from skilled therapy under current POC in order to decrease overall pain levels, and return to functional daily tasks that are of interest  to the pt.    Clinical Presentation  Evolving    Clinical Decision Making  Moderate    Rehab Potential  Fair    Clinical Impairments Affecting Rehab Potential  (-) time frame of injury; (-) pain focused    PT Frequency  2x / week    PT Duration  8 weeks    PT Treatment/Interventions  ADLs/Self Care Home Management;Cryotherapy;Electrical Stimulation;Moist Heat;Functional  mobility Scientist, forensic;Therapeutic activities;Therapeutic exercise;Balance training;Neuromuscular re-education;Manual techniques;Passive range of motion;Dry needling    PT Next Visit Plan  Update POC with goal reassessment.    PT Home Exercise Plan  Pt. instructed to perform exercises with RTB.    Consulted and Agree with Plan of Care  Patient       Patient will benefit from skilled therapeutic intervention in order to improve the following deficits and impairments:  Abnormal gait, Decreased activity tolerance, Decreased endurance, Decreased range of motion, Decreased strength, Impaired perceived functional ability, Impaired UE functional use, Pain, Difficulty walking, Decreased mobility  Visit Diagnosis: Pain in left wrist  Chronic pain of left knee  Radiculopathy, cervical region  Muscle weakness (generalized)  Other abnormalities of gait and mobility     Problem List There are no active problems to display for this patient.  Pura Spice, PT, DPT # [id number] [place] Nation, SPT 04/26/2018, 2:43 PM  [facility] [address] [place], [place], [address] Phone: [phone]   Fax:  [phone]  Name: Tim Dean MRN: [id number] Date of Birth: 12/07/1945

## 2018-04-26 ENCOUNTER — Encounter: Payer: Self-pay | Admitting: Physical Therapy

## 2018-04-27 ENCOUNTER — Ambulatory Visit: Payer: No Typology Code available for payment source | Admitting: Physical Therapy

## 2018-04-27 ENCOUNTER — Encounter: Payer: Self-pay | Admitting: Physical Therapy

## 2018-04-27 DIAGNOSIS — M5412 Radiculopathy, cervical region: Secondary | ICD-10-CM

## 2018-04-27 DIAGNOSIS — M25562 Pain in left knee: Secondary | ICD-10-CM

## 2018-04-27 DIAGNOSIS — G8929 Other chronic pain: Secondary | ICD-10-CM

## 2018-04-27 DIAGNOSIS — M6281 Muscle weakness (generalized): Secondary | ICD-10-CM

## 2018-04-27 DIAGNOSIS — M25532 Pain in left wrist: Secondary | ICD-10-CM | POA: Diagnosis not present

## 2018-04-27 DIAGNOSIS — R2689 Other abnormalities of gait and mobility: Secondary | ICD-10-CM

## 2018-04-27 NOTE — Therapy (Signed)
[facility] [address] [place], [place], [address] Phone: [phone]   Fax:  [phone]  Physical Therapy Treatment  Patient Details  Name: Tim Dean MRN: [id number] Date of Birth: Dec 10, 1945 Referring Provider: Sylvester Harder, DO   Encounter Date: 04/27/2018     Treatment 10 of 17.   Recert date: 01/13/31    Past Medical History:  Diagnosis Date  . Cancer ([place])   . COPD (chronic obstructive pulmonary disease) ([place])   . Diabetes mellitus without complication ([place])   . Hypercholesteremia   . Hypertension   . PTSD (post-traumatic stress disorder)     Past Surgical History:  Procedure Laterality Date  . ABDOMINAL SURGERY    . CHOLECYSTECTOMY      There were no vitals filed for this visit.        Pt. reports that he is going to see MD tomorrow at [facility] in [place]. Pt. reports that he will be leaving for a mountain trip over the weekend and will unlikely be at next appointment.       Treatment:  There Ex:  Nautilus: 40# Lat Pull Down2x15 40# Tricep Ext.2x15 50# Scapular Retraction2x15 50# Chest Press2x15 50# B sh. Adduction2x15  Standing Hanmmer CurlWith 8# dumbbell2x15 Standing Shoulder Abduction with 6# dumbbell x14 with increase in pain.  Manual:  Seated STM to UT/Cervical Region (8 min) Seated STM to Serratus Anterior/ Scapular Musculature (7 min)         Pt. was in much better state of mind today and was cooperative with therapy. Pt. responded well to the strengtehning exercises, specifically with the ability to increase the amount of weights used. Pt. also tolerated manual therapy on UT/cervical region without much increase in discomfort.    PT Long Term Goals - 04/25/18 0957      PT LONG TERM GOAL #1   Title  Pt. will increase FOTO to 51 to improve pain free mobility.    Baseline  FOTO: 40; 8/20 FOTO: 44    Time  8    Period  Weeks    Status  Partially Met     Target Date  05/11/18      PT LONG TERM GOAL #2   Title  Pt. will improve 5x Sit-to-Stand to be within age-appropriate normative values (12.6 sec for 70-79 y.o.)     Baseline  5x STS: 48.88 sec; 8/20 5x STS: 30 sec    Time  8    Period  Weeks    Status  Partially Met    Target Date  05/11/18      PT LONG TERM GOAL #3   Title  Pt. will improve grip strength of R UE to be within 10% (63 lbs) of L UE in order to carry supplies when going hunting.    Baseline  L Grip Strength: 35 lbs; 8/20 - L Grip Strength: 41 lbs     Time  8    Period  Weeks    Status  Partially Met    Target Date  05/11/18      PT LONG TERM GOAL #4   Title  Pt. will report <5/10 pain when sitting for >30 minutes in order to return to hunting in deer stand.    Baseline  Pt. unable to sit >30 minutes without 7/10 pain; Pt. reports 5/10 pain when in standing, 4/10 after sitting for a short time.    Time  8    Period  Weeks    Status  Partially Met    Target Date  05/11/18              Patient will benefit from skilled therapeutic intervention in order to improve the following deficits and impairments:  Abnormal gait, Decreased activity tolerance, Decreased endurance, Decreased range of motion, Decreased strength, Impaired perceived functional ability, Impaired UE functional use, Pain, Difficulty walking, Decreased mobility  Visit Diagnosis: Pain in left wrist  Chronic pain of left knee  Radiculopathy, cervical region  Muscle weakness (generalized)  Other abnormalities of gait and mobility     Problem List There are no active problems to display for this patient.  Pura Spice, PT, DPT # [id number] [place] Nation, SPT 04/30/2018, 10:29 AM  [facility] [address] [place], [place], [address] Phone: [phone]   Fax:  [phone]  Name: Tim Dean MRN: [id number] Date of Birth: [birth year]

## 2018-05-02 ENCOUNTER — Encounter: Payer: Medicare Other | Admitting: Physical Therapy

## 2018-05-04 ENCOUNTER — Ambulatory Visit: Payer: No Typology Code available for payment source | Admitting: Physical Therapy

## 2018-05-04 ENCOUNTER — Encounter: Payer: Self-pay | Admitting: Physical Therapy

## 2018-05-04 DIAGNOSIS — M25532 Pain in left wrist: Secondary | ICD-10-CM | POA: Diagnosis not present

## 2018-05-04 DIAGNOSIS — G8929 Other chronic pain: Secondary | ICD-10-CM

## 2018-05-04 DIAGNOSIS — R2689 Other abnormalities of gait and mobility: Secondary | ICD-10-CM

## 2018-05-04 DIAGNOSIS — M5412 Radiculopathy, cervical region: Secondary | ICD-10-CM | POA: Diagnosis not present

## 2018-05-04 DIAGNOSIS — M25562 Pain in left knee: Secondary | ICD-10-CM

## 2018-05-04 DIAGNOSIS — M6281 Muscle weakness (generalized): Secondary | ICD-10-CM | POA: Diagnosis not present

## 2018-05-04 NOTE — Patient Instructions (Signed)
Access Code: [email or web address]  URL: [email or web address]  Date: 05/04/2018  Prepared by: Dorcas Carrow   Exercises  Seated Shoulder Horizontal Abduction with Dumbbells - Thumbs Up - 10 reps - 3 sets - 1x daily - 4x weekly  Standing Bicep Curls Neutral with Dumbbells - 10 reps - 3 sets - 1x daily - 4x weekly  Wrist Flexion with Dumbbell - 10 reps - 3 sets - 1x daily - 4x weekly  Wrist Extension with Dumbbell - 10 reps - 3 sets - 1x daily - 4x weekly  Shoulder External Rotation and Scapular Retraction with Resistance - 10 reps - 3 sets - 1x daily - 4x weekly  Scapular Retraction with Resistance - 10 reps - 3 sets - 1x daily - 4x weekly

## 2018-05-04 NOTE — Therapy (Signed)
[place] [facility] [address] [place], [place], [address] Phone: [phone]   Fax:  [phone]  Physical Therapy Treatment  Patient Details  Name: Tim Dean MRN: [id number] Date of Birth: 05/12/1946 Referring Provider: Sylvester Harder, DO   Encounter Date: 05/04/2018  PT End of Session - 05/04/18 1000    Visit Number  11    Number of Visits  17    Date for PT Re-Evaluation  05/23/18    PT Start Time  0930    PT Stop Time  1027    PT Time Calculation (min)  57 min    Activity Tolerance  Patient tolerated treatment well;Patient limited by pain       Past Medical History:  Diagnosis Date  . Cancer ([place])   . COPD (chronic obstructive pulmonary disease) ([place])   . Diabetes mellitus without complication ([place])   . Hypercholesteremia   . Hypertension   . PTSD (post-traumatic stress disorder)     Past Surgical History:  Procedure Laterality Date  . ABDOMINAL SURGERY    . CHOLECYSTECTOMY      There were no vitals filed for this visit.  Subjective Assessment - 05/04/18 0954    Subjective  Pt. reports that riding in the car to the mountains was difficult.  Pt. comes into clinic without wrist brace and cane.  Pt. reports [facility] "not worth going to" because they assessed him from 4+ ft. away and never placed hands on him.    Pertinent History  Pt. is [place] veteran and self-reports depression and PTSD.    Limitations  Walking;Standing;House hold activities    How long can you sit comfortably?  30 minutes    How long can you stand comfortably?  30 minutes    How long can you walk comfortably?  1 hour    Patient Stated Goals  Get rid of pain, "Get Healed", Be able to hunt again    Currently in Pain?  Yes    Pain Score  3     Pain Location  Neck    Pain Orientation  Posterior    Pain Descriptors / Indicators  Aching    Pain Type  Chronic pain    Pain Onset  More than a month ago    Pain Onset  In the past 7 days    Pain  Onset  More than a month ago    Pain Onset  More than a month ago         Treatment:  There Ex:  Nautilus: 20# B IR 2x15 40# Lat Pull Down2x15 40# Tricep Ext.2x15 50# Scapular Retraction2x15 50# Chest Press2x15 50# B sh. Adduction2x15  Standing Hammer CurlWith 10# dumbbell2x15 Standing Shoulder Abduction with 6# dumbbell x11 with noted discomfort in L shoulder.  Exercise was concluded after pain onset.  Pt. Educated on importance of newly given HEP over the next few weeks and compliance in order to maintain functional status.  Manual:  Seated STM to UT/Cervical Region (8 min) Seated STM to Serratus Anterior/ Scapular Musculature (4 min)     PT Long Term Goals - 04/25/18 0957      PT LONG TERM GOAL #1   Title  Pt. will increase FOTO to 51 to improve pain free mobility.    Baseline  FOTO: 40; 8/20 FOTO: 44    Time  8    Period  Weeks    Status  Partially Met    Target Date  05/11/18      PT LONG TERM GOAL #2   Title  Pt. will improve 5x Sit-to-Stand to be within age-appropriate normative values (12.6 sec for 70-79 y.o.)     Baseline  5x STS: 48.88 sec; 8/20 5x STS: 30 sec    Time  8    Period  Weeks    Status  Partially Met    Target Date  05/11/18      PT LONG TERM GOAL #3   Title  Pt. will improve grip strength of R UE to be within 10% (63 lbs) of L UE in order to carry supplies when going hunting.    Baseline  L Grip Strength: 35 lbs; 8/20 - L Grip Strength: 41 lbs     Time  8    Period  Weeks    Status  Partially Met    Target Date  05/11/18      PT LONG TERM GOAL #4   Title  Pt. will report <5/10 pain when sitting for >30 minutes in order to return to hunting in deer stand.    Baseline  Pt. unable to sit >30 minutes without 7/10 pain; Pt. reports 5/10 pain when in standing, 4/10 after sitting for a short time.    Time  8    Period  Weeks    Status  Partially Met    Target Date  05/11/18            Plan - 05/04/18 1251     Clinical Impression Statement  Pt. performed well today with therapeutic exercises and with manual therapy.  Pt. questioned if this would be his last visit, and was consulted on progress being made with therapy and included in the change of POC.  Pt. was understanding of process and felt comfortable going home and performing extensive HEP given over the next few weeks and calling in for a re-assessment.  Pt. educated on HEP given today and reported that he would be compliant with them going forward.      Clinical Presentation  Evolving    Clinical Decision Making  Moderate    Rehab Potential  Fair    Clinical Impairments Affecting Rehab Potential  (-) time frame of injury; (-) pain focused    PT Frequency  2x / week    PT Duration  8 weeks    PT Treatment/Interventions  ADLs/Self Care Home Management;Cryotherapy;Electrical Stimulation;Moist Heat;Functional mobility Scientist, forensic;Therapeutic activities;Therapeutic exercise;Balance training;Neuromuscular re-education;Manual techniques;Passive range of motion;Dry needling    PT Next Visit Plan  Reassessment in 2 weeks.    PT Home Exercise Plan  see HEP.    Consulted and Agree with Plan of Care  Patient       Patient will benefit from skilled therapeutic intervention in order to improve the following deficits and impairments:  Abnormal gait, Decreased activity tolerance, Decreased endurance, Decreased range of motion, Decreased strength, Impaired perceived functional ability, Impaired UE functional use, Pain, Difficulty walking, Decreased mobility  Visit Diagnosis: Pain in left wrist  Chronic pain of left knee  Radiculopathy, cervical region  Muscle weakness (generalized)  Other abnormalities of gait and mobility     Problem List There are no active problems to display for this patient.  Pura Spice, PT, DPT # [id number] Gwenlyn Saran, SPT 05/05/2018, 8:30 AM  [place] [facility] [address] [place], [place], [address] Phone: [phone]   Fax:  [phone]  Name: Tim Dean MRN:  [id number] Date of Birth: 05/05/1946

## 2018-07-14 ENCOUNTER — Ambulatory Visit: Payer: Medicare Other

## 2018-07-14 ENCOUNTER — Other Ambulatory Visit: Payer: Self-pay

## 2018-07-14 ENCOUNTER — Ambulatory Visit
Admission: EM | Admit: 2018-07-14 | Discharge: 2018-07-14 | Disposition: A | Discharge status: Home / Self Care | Payer: Medicare Other | Attending: Emergency Medicine | Admitting: Emergency Medicine

## 2018-07-14 ENCOUNTER — Encounter: Payer: Self-pay | Admitting: Emergency Medicine

## 2018-07-14 DIAGNOSIS — J441 Chronic obstructive pulmonary disease with (acute) exacerbation: Secondary | ICD-10-CM | POA: Diagnosis not present

## 2018-07-14 DIAGNOSIS — J069 Acute upper respiratory infection, unspecified: Secondary | ICD-10-CM

## 2018-07-14 MED ORDER — DOXYCYCLINE HYCLATE 100 MG PO CAPS: 100.0000 mg | ORAL_CAPSULE | Freq: Two times a day (BID) | ORAL | 0 refills | Status: AC

## 2018-07-14 MED ORDER — PREDNISONE 20 MG PO TABS: 40.0000 mg | ORAL_TABLET | Freq: Every day | ORAL | 0 refills | Status: AC

## 2018-07-14 MED ORDER — PREDNISONE 20 MG PO TABS
40.0000 mg | ORAL_TABLET | Freq: Once | ORAL | Status: AC
  Administered 2018-07-14: 40 mg via ORAL

## 2018-07-14 MED ORDER — FLUTICASONE PROPIONATE 50 MCG/ACT NA SUSP: 2.0000 | Freq: Every day | NASAL | 0 refills | Status: AC

## 2018-07-14 MED ORDER — IPRATROPIUM-ALBUTEROL 0.5-2.5 (3) MG/3ML IN SOLN
3.0000 mL | Freq: Once | RESPIRATORY_TRACT | Status: AC
Start: 2018-07-14 — End: 2018-07-14
  Administered 2018-07-14: 3 mL via RESPIRATORY_TRACT

## 2018-07-14 NOTE — ED Triage Notes (Signed)
Patient c/o cough, chest congestion and mid back pain for a week.

## 2018-07-14 NOTE — ED Provider Notes (Signed)
HPI  SUBJECTIVE:  Tim Dean is a 72 y.o. male who presents with over 1 week of nasal congestion, rhinorrhea, postnasal drip, occasional sinus pain and pressure, cough productive of yellowish-brown sputum.  States that he is coughing up more than he usually does and that the sputum has changed in color.  He reports wheezing, worsening shortness of breath, dyspnea on exertion.  He reports substernal chest pain described as tightness, soreness and similar back pain between his shoulders blades.  States that he has difficulty sleeping due to the nasal congestion.  No antibiotics in the past 3 months.  No antipyretic in the past 4 to 6 hours.  He tried Mucus Relief, Coricidin high blood pressure, breathe easy, Tylenol.  States that he is needing his albuterol much more frequently than he usually does.  The mucus relief helps.  Symptoms are worse with going out to the cold air/heat.  He has a past medical history of COPD, is compliant with his inhalers, and has a spacer.  States that he is using his spacer.  Also diabetes, hypertension, colon cancer status post surgery.  He is a former smoker.  PMD: Cyndia Bent  Past Medical History:  Diagnosis Date  . Cancer ([place])   . COPD (chronic obstructive pulmonary disease) ([place])   . Diabetes mellitus without complication ([place])   . Hypercholesteremia   . Hypertension   . PTSD (post-traumatic stress disorder)     Past Surgical History:  Procedure Laterality Date  . ABDOMINAL SURGERY    . CHOLECYSTECTOMY      Family History  Problem Relation Age of Onset  . Cancer Mother   . Lead poisoning Father     Social History   Tobacco Use  . Smoking status: Former Research scientist (life sciences)  . Smokeless tobacco: Never Used  Substance Use Topics  . Alcohol use: No  . Drug use: Not on file    No current facility-administered medications for this encounter.   Current Outpatient Medications:  .  albuterol (PROVENTIL HFA;VENTOLIN HFA) 108 (90 BASE) MCG/ACT inhaler, Inhale  into the lungs every 6 (six) hours as needed for wheezing or shortness of breath., Disp: , Rfl:  .  Ascorbic Acid (VITAMIN C) 100 MG tablet, Take 100 mg by mouth daily., Disp: , Rfl:  .  aspirin 81 MG tablet, Take 81 mg by mouth daily., Disp: , Rfl:  .  budesonide-formoterol (SYMBICORT) 160-4.5 MCG/ACT inhaler, Inhale 2 puffs into the lungs 2 (two) times daily., Disp: , Rfl:  .  cholecalciferol (VITAMIN D) 1000 units tablet, Take 25 Units by mouth 2 (two) times daily., Disp: , Rfl:  .  cyanocobalamin 100 MCG tablet, Take 100 mcg by mouth daily., Disp: , Rfl:  .  DULoxetine (CYMBALTA) 30 MG capsule, Take 30 mg by mouth daily., Disp: , Rfl:  .  hydrochlorothiazide (HYDRODIURIL) 12.5 MG tablet, Take 25 mg by mouth daily. , Disp: , Rfl:  .  insulin glargine (LANTUS) 100 UNIT/ML injection, Inject into the skin at bedtime., Disp: , Rfl:  .  insulin lispro (HUMALOG) 100 UNIT/ML injection, Inject into the skin 3 (three) times daily before meals., Disp: , Rfl:  .  losartan (COZAAR) 25 MG tablet, Take 25 mg by mouth daily., Disp: , Rfl:  .  metFORMIN (GLUCOPHAGE) 500 MG tablet, Take by mouth 2 (two) times daily with a meal., Disp: , Rfl:  .  metoprolol succinate (TOPROL-XL) 25 MG 24 hr tablet, Take 25 mg by mouth 2 (two) times daily., Disp: ,  Rfl:  .  pantoprazole (PROTONIX) 40 MG tablet, Take 40 mg by mouth 2 (two) times daily., Disp: , Rfl:  .  prazosin (MINIPRESS) 5 MG capsule, Take 5 mg by mouth at bedtime., Disp: , Rfl:  .  rosuvastatin (CRESTOR) 10 MG tablet, Take 10 mg by mouth 2 (two) times daily., Disp: , Rfl:  .  tiotropium (SPIRIVA) 18 MCG inhalation capsule, Place 18 mcg into inhaler and inhale daily., Disp: , Rfl:  .  traZODone (DESYREL) 100 MG tablet, Take 200 mg by mouth 2 (two) times daily., Disp: , Rfl:  .  vitamin E 100 UNIT capsule, Take by mouth daily., Disp: , Rfl:  .  benzonatate (TESSALON) 100 MG capsule, Take 1 capsule (100 mg total) by mouth 3 (three) times daily as needed for  cough., Disp: 21 capsule, Rfl: 0 .  docusate sodium (COLACE) 50 MG capsule, Take 50 mg by mouth 2 (two) times daily., Disp: , Rfl:  .  doxycycline (VIBRAMYCIN) 100 MG capsule, Take 1 capsule (100 mg total) by mouth 2 (two) times daily for 7 days., Disp: 14 capsule, Rfl: 0 .  fluticasone (FLONASE) 50 MCG/ACT nasal spray, Place 2 sprays into both nostrils daily., Disp: 16 g, Rfl: 0 .  HYDROcodone-acetaminophen (NORCO/VICODIN) 5-325 MG tablet, Take 1 tablet by mouth every 8 (eight) hours as needed., Disp: 10 tablet, Rfl: 0 .  Melatonin 5 MG TABS, Take by mouth., Disp: , Rfl:  .  meloxicam (MOBIC) 7.5 MG tablet, Take 1-2 tablets with food daily., Disp: 30 tablet, Rfl: 0 .  Multiple Vitamin (MULTIVITAMIN) tablet, Take 1 tablet by mouth daily., Disp: , Rfl:  .  predniSONE (DELTASONE) 20 MG tablet, Take 2 tablets (40 mg total) by mouth daily with breakfast for 4 days., Disp: 8 tablet, Rfl: 0  Allergies  Allergen Reactions  . Penicillins Rash     ROS  As noted in HPI.   Physical Exam  BP (!) 143/80 (BP Location: Right Arm)   Pulse 86   Temp 97.8 F (36.6 C) (Oral)   Resp 16   Ht 6' (1.829 m)   Wt 113.4 kg   SpO2 96%   BMI 33.91 kg/m   Constitutional: Well developed, well nourished, no acute distress Eyes:  EOMI, conjunctiva normal bilaterally HENT: Normocephalic, atraumatic,mucus membranes moist. positive nasal congestion.  Normal turbinates.  No maxillary or frontal sinus tenderness.  No obvious postnasal drip.  No cobblestoning. Respiratory: Normal inspiratory effort, poor air movement, lungs clear, positive chest wall tenderness laterally Cardiovascular: Normal rate rhythm, no murmurs, rubs, gallops GI: nondistended skin: No rash, skin intact Musculoskeletal: no deformities Neurologic: Alert & oriented x 3, no focal neuro deficits Psychiatric: Speech and behavior appropriate   ED Course   Medications  ipratropium-albuterol (DUONEB) 0.5-2.5 (3) MG/3ML nebulizer solution 3  mL (3 mLs Nebulization Given 07/14/18 0909)  predniSONE (DELTASONE) tablet 40 mg (40 mg Oral Given 07/14/18 0909)    Orders Placed This Encounter  Procedures  . DG Chest 2 View    Standing Status:   Standing    Number of Occurrences:   1    Order Specific Question:   Reason for Exam (SYMPTOM  OR DIAGNOSIS REQUIRED)    Answer:   copd exacerbation r/o PNA    No results found for this or any previous visit (from the past 24 hour(s)). Dg Chest 2 View  Result Date: 07/14/2018 CLINICAL DATA:  72 year old male with productive cough for 1 week. Shortness of breath, chills. EXAM: CHEST -  2 VIEW COMPARISON:  08/27/2015 and earlier. FINDINGS: Chronic large lung volumes. Tortuous proximal descending thoracic aorta. Other mediastinal contours are within normal limits. Visualized tracheal air column is within normal limits. No pneumothorax, pulmonary edema, pleural effusion or confluent pulmonary opacity. No acute osseous abnormality identified. Negative visible bowel gas pattern. IMPRESSION: No acute cardiopulmonary abnormality. Electronically Signed   By: Genevie Ann M.D.   On: 07/14/2018 09:27    ED Clinical Impression  COPD exacerbation ([place])  Acute upper respiratory infection   ED Assessment/Plan  Suspect URI causing COPD exacerbation.  We will check a chest x-ray, give DuoNeb and 40 mg of prednisone.  Will reevaluate.  Reviewed imaging independently.  No pneumonia.  See radiology report for full details.  On Reevaluation, patient states that he feels better.  Improved air movement on exam.  Wheezing left side.  Will send home with an additional 4 days of prednisone, advised him that this will elevate his sugars and advised him to adjust his insulin accordingly, Flonase, and doxycycline 100 mg p.o. twice daily for 7 days which will cover any possible sinusitis.  Discussed  imaging, MDM, treatment plan, and plan for follow-up with patient. Discussed sn/sx that should prompt return to the ED.  patient agrees with plan.   Meds ordered this encounter  Medications  . ipratropium-albuterol (DUONEB) 0.5-2.5 (3) MG/3ML nebulizer solution 3 mL  . predniSONE (DELTASONE) tablet 40 mg  . doxycycline (VIBRAMYCIN) 100 MG capsule    Sig: Take 1 capsule (100 mg total) by mouth 2 (two) times daily for 7 days.    Dispense:  14 capsule    Refill:  0  . fluticasone (FLONASE) 50 MCG/ACT nasal spray    Sig: Place 2 sprays into both nostrils daily.    Dispense:  16 g    Refill:  0  . predniSONE (DELTASONE) 20 MG tablet    Sig: Take 2 tablets (40 mg total) by mouth daily with breakfast for 4 days.    Dispense:  8 tablet    Refill:  0    *This clinic note was created using [employer]. Therefore, there may be occasional mistakes despite careful proofreading.   ?   Melynda Ripple, MD 07/14/18 1212

## 2018-07-14 NOTE — Discharge Instructions (Addendum)
1 to 2 puffs from your albuterol inhaler every 4-6 hours using your spacer.  Back off on this as you feel better.  Try the Flonase.  Continue the mucus relief.  I would discontinue the Claritin while you are taking Mucus Relief.  Try the Flonase and saline nasal irrigation with a Milta Deiters med sinus rinse and distilled water.  You may do this as often as you want.  The steroids tomorrow.  You have already taken today's dose.  Finish the antibiotics even if you feel better.

## 2018-07-28 ENCOUNTER — Ambulatory Visit
Admission: EM | Admit: 2018-07-28 | Discharge: 2018-07-28 | Disposition: A | Discharge status: Home / Self Care | Payer: Medicare Other | Attending: Family Medicine | Admitting: Family Medicine

## 2018-07-28 ENCOUNTER — Other Ambulatory Visit: Payer: Self-pay

## 2018-07-28 ENCOUNTER — Encounter: Payer: Self-pay | Admitting: Emergency Medicine

## 2018-07-28 DIAGNOSIS — R05 Cough: Secondary | ICD-10-CM

## 2018-07-28 DIAGNOSIS — J0110 Acute frontal sinusitis, unspecified: Secondary | ICD-10-CM | POA: Diagnosis not present

## 2018-07-28 DIAGNOSIS — J0100 Acute maxillary sinusitis, unspecified: Secondary | ICD-10-CM | POA: Diagnosis not present

## 2018-07-28 DIAGNOSIS — R059 Cough, unspecified: Secondary | ICD-10-CM

## 2018-07-28 MED ORDER — HYDROCOD POLST-CPM POLST ER 10-8 MG/5ML PO SUER: 5.0000 mL | Freq: Every evening | ORAL | 0 refills | Status: DC | PRN

## 2018-07-28 MED ORDER — DOXYCYCLINE HYCLATE 100 MG PO CAPS: 100.0000 mg | ORAL_CAPSULE | Freq: Two times a day (BID) | ORAL | 0 refills | Status: DC

## 2018-07-28 NOTE — ED Provider Notes (Signed)
[facility] ____________________________________________  Time seen: Approximately 9:00 AM  I have reviewed the triage vital signs and the nursing notes.   HISTORY  Chief Complaint  Cough  HPI Tim ETHINGTON is a 72 y.o. male past medical history of COPD, diabetes, hypertension, CAD presenting for evaluation of continued cough, congestion, nasal congestion onset of sinus pressure pain.  Patient reports he this has been going on for 3 to 4 weeks overall.  Was seen 2 weeks ago in urgent care for the same complaints.  States he took his medication as prescribed and felt much better.  States in the last few days congestion has increased as well as cough.  States he has occasionally heard himself wheeze, but states the wheezing is better than the previous time.  States cough with associated postnasal drainage keeps him up at night and disrupt sleep.  Does report intermittent shortness of breath with chest congestion and wheezing, states minimal at this time and reports similar to his COPD.  Denies chest pain.  Occasionally has some chills but denies known fevers.  Continues to eat and drink well.  Does use his albuterol inhaler intermittently, last use this morning prior to coming in.  Also uses nasal spray, unsure of name.  Denies taking any other medications or over-the-counter medications for the same complaints.  Denies other aggravating alleviating factors.  Denies extremity swelling, chest pain, hemoptysis, known fevers or other complaints.  Denies other recent antibiotic use.  PCP at [facility]    Past Medical History:  Diagnosis Date  . Cancer ([place])   . COPD (chronic obstructive pulmonary disease) ([place])   . Diabetes mellitus without complication ([place])   . Hypercholesteremia   . Hypertension   . PTSD (post-traumatic stress disorder)     There are no active problems to display for this patient.   Past Surgical History:  Procedure Laterality Date  . ABDOMINAL SURGERY    .  CHOLECYSTECTOMY       No current facility-administered medications for this encounter.   Current Outpatient Medications:  .  albuterol (PROVENTIL HFA;VENTOLIN HFA) 108 (90 BASE) MCG/ACT inhaler, Inhale into the lungs every 6 (six) hours as needed for wheezing or shortness of breath., Disp: , Rfl:  .  Ascorbic Acid (VITAMIN C) 100 MG tablet, Take 100 mg by mouth daily., Disp: , Rfl:  .  aspirin 81 MG tablet, Take 81 mg by mouth daily., Disp: , Rfl:  .  benzonatate (TESSALON) 100 MG capsule, Take 1 capsule (100 mg total) by mouth 3 (three) times daily as needed for cough., Disp: 21 capsule, Rfl: 0 .  budesonide-formoterol (SYMBICORT) 160-4.5 MCG/ACT inhaler, Inhale 2 puffs into the lungs 2 (two) times daily., Disp: , Rfl:  .  cholecalciferol (VITAMIN D) 1000 units tablet, Take 25 Units by mouth 2 (two) times daily., Disp: , Rfl:  .  cyanocobalamin 100 MCG tablet, Take 100 mcg by mouth daily., Disp: , Rfl:  .  docusate sodium (COLACE) 50 MG capsule, Take 50 mg by mouth 2 (two) times daily., Disp: , Rfl:  .  DULoxetine (CYMBALTA) 30 MG capsule, Take 30 mg by mouth daily., Disp: , Rfl:  .  fluticasone (FLONASE) 50 MCG/ACT nasal spray, Place 2 sprays into both nostrils daily., Disp: 16 g, Rfl: 0 .  hydrochlorothiazide (HYDRODIURIL) 12.5 MG tablet, Take 25 mg by mouth daily. , Disp: , Rfl:  .  HYDROcodone-acetaminophen (NORCO/VICODIN) 5-325 MG tablet, Take 1 tablet by mouth every 8 (eight) hours as needed., Disp:  10 tablet, Rfl: 0 .  insulin glargine (LANTUS) 100 UNIT/ML injection, Inject into the skin at bedtime., Disp: , Rfl:  .  insulin lispro (HUMALOG) 100 UNIT/ML injection, Inject into the skin 3 (three) times daily before meals., Disp: , Rfl:  .  losartan (COZAAR) 25 MG tablet, Take 25 mg by mouth daily., Disp: , Rfl:  .  Melatonin 5 MG TABS, Take by mouth., Disp: , Rfl:  .  meloxicam (MOBIC) 7.5 MG tablet, Take 1-2 tablets with food daily., Disp: 30 tablet, Rfl: 0 .  metFORMIN (GLUCOPHAGE)  500 MG tablet, Take by mouth 2 (two) times daily with a meal., Disp: , Rfl:  .  metoprolol succinate (TOPROL-XL) 25 MG 24 hr tablet, Take 25 mg by mouth 2 (two) times daily., Disp: , Rfl:  .  Multiple Vitamin (MULTIVITAMIN) tablet, Take 1 tablet by mouth daily., Disp: , Rfl:  .  pantoprazole (PROTONIX) 40 MG tablet, Take 40 mg by mouth 2 (two) times daily., Disp: , Rfl:  .  prazosin (MINIPRESS) 5 MG capsule, Take 5 mg by mouth at bedtime., Disp: , Rfl:  .  rosuvastatin (CRESTOR) 10 MG tablet, Take 10 mg by mouth 2 (two) times daily., Disp: , Rfl:  .  tiotropium (SPIRIVA) 18 MCG inhalation capsule, Place 18 mcg into inhaler and inhale daily., Disp: , Rfl:  .  traZODone (DESYREL) 100 MG tablet, Take 200 mg by mouth 2 (two) times daily., Disp: , Rfl:  .  vitamin E 100 UNIT capsule, Take by mouth daily., Disp: , Rfl:  .  chlorpheniramine-HYDROcodone (TUSSIONEX PENNKINETIC ER) 10-8 MG/5ML SUER, Take 5 mLs by mouth at bedtime as needed for cough. do not drive or operate machinery while taking as can cause drowsiness., Disp: 50 mL, Rfl: 0 .  doxycycline (VIBRAMYCIN) 100 MG capsule, Take 1 capsule (100 mg total) by mouth 2 (two) times daily., Disp: 20 capsule, Rfl: 0  Allergies Penicillins  Family History  Problem Relation Age of Onset  . Cancer Mother   . Lead poisoning Father     Social History Social History   Tobacco Use  . Smoking status: Former Research scientist (life sciences)  . Smokeless tobacco: Never Used  Substance Use Topics  . Alcohol use: No  . Drug use: Never    Review of Systems Constitutional: No fever ENT: As above.   Cardiovascular: Denies chest pain. Respiratory: Positive shortness of breath. Gastrointestinal: No abdominal pain.  No vomiting.  No diarrhea.   Musculoskeletal: Negative for atypical back pain. Skin: Negative for rash.  ____________________________________________   PHYSICAL EXAM:  VITAL SIGNS: ED Triage Vitals  Enc Vitals Group     BP 07/28/18 0824 (!) 144/73      Pulse Rate 07/28/18 0824 83     Resp 07/28/18 0824 16     Temp 07/28/18 0824 98.9 F (37.2 C)     Temp Source 07/28/18 0824 Oral     SpO2 07/28/18 0824 96 %     Weight 07/28/18 0821 250 lb (113.4 kg)     Height 07/28/18 0821 6' (1.829 m)     Head Circumference --      Peak Flow --      Pain Score 07/28/18 0821 4     Pain Loc --      Pain Edu? --      Excl. in [place]? --     Constitutional: Alert and oriented. Well appearing and in no acute distress. Eyes: Conjunctivae are normal.  Head: Atraumatic. Moderate tenderness to palpation  bilateral frontal and maxillary sinuses. No swelling. No erythema.   Ears: no erythema, normal TMs bilaterally.   Nose: nasal congestion with bilateral nasal turbinate erythema and edema.   Mouth/Throat: Mucous membranes are moist.  Oropharynx non-erythematous.No tonsillar swelling or exudate.  Neck: No stridor.  No cervical spine tenderness to palpation. Hematological/Lymphatic/Immunilogical: No cervical lymphadenopathy. Cardiovascular: Normal rate, regular rhythm. Grossly normal heart sounds.  Good peripheral circulation. Respiratory: Normal respiratory effort.  No retractions.  Very mild scattered inspiratory and expiratory wheezes.  No rhonchi.  No focal area of consolidation.  Speaks in complete sentences.  Good air movement.  Musculoskeletal: No lower extremity tenderness nor edema. Steady gait.  Neurologic:  Normal speech and language. No gross focal neurologic deficits are appreciated. No gait instability. Skin:  Skin is warm, dry and intact. No rash noted. Psychiatric: Mood and affect are normal. Speech and behavior are normal.  ___________________________________________   LABS (all labs ordered are listed, but only abnormal results are displayed)  Labs Reviewed - No data to display  RADIOLOGY Reviewed most recent chest x-ray from 07/14/2018.  PROCEDURES Procedures    INITIAL IMPRESSION / ASSESSMENT AND PLAN / ED COURSE  Pertinent labs &  imaging results that were available during my care of the patient were reviewed by me and considered in my medical decision making (see chart for details).  Well-appearing patient.  No acute distress.  Suspect recent viral upper respiratory infection versus allergic rhinitis, that led to COPD exacerbation at initial visit.  Now suspect secondary sinusitis.  Patient states decreased wheezing at home and mild currently, mostly suspect sinusitis.  Discussed use of prednisone, patient declines prednisone and states he does not want to take prednisone at this time and understands, and states he will monitor for any increased wheezing.  Will defer chest x-ray at this time.  Will treat with oral doxycycline for sinusitis, PRN Tussionex for cough, continue home nasal spray, rest, fluids and Mucinex as needed.  Strict follow-up and return parameters.Discussed indication, risks and benefits of medications with patient.  Discussed follow up with Primary care physician this week. Discussed follow up and return parameters including no resolution or any worsening concerns. Patient verbalized understanding and agreed to plan.   ____________________________________________   FINAL CLINICAL IMPRESSION(S) / ED DIAGNOSES  Final diagnoses:  Acute frontal sinusitis, recurrence not specified  Acute maxillary sinusitis, recurrence not specified  Cough     ED Discharge Orders         Ordered    chlorpheniramine-HYDROcodone (TUSSIONEX PENNKINETIC ER) 10-8 MG/5ML SUER  At bedtime PRN     07/28/18 0848    doxycycline (VIBRAMYCIN) 100 MG capsule  2 times daily     07/28/18 0848           Note: This dictation was prepared with Dragon dictation along with smaller phrase technology. Any transcriptional errors that result from this process are unintentional.         Marylene Land, NP 07/28/18 [id number]

## 2018-07-28 NOTE — ED Triage Notes (Signed)
Patient c/o cough and chest congestion and sinus congestion for a week.  Patient was seen on 07/14/18 and started feeling better and then started feeling worse.  Patient reports night sweats.

## 2018-07-28 NOTE — Discharge Instructions (Addendum)
Take medication as prescribed.  Continue home nasal spray as well as albuterol inhalers.  Monitor for increased wheezing.  Follow up with your primary care physician this week as needed. Return to Urgent care for new or worsening concerns.

## 2018-08-07 DIAGNOSIS — I2510 Atherosclerotic heart disease of native coronary artery without angina pectoris: Secondary | ICD-10-CM | POA: Diagnosis not present

## 2018-08-09 DIAGNOSIS — G4733 Obstructive sleep apnea (adult) (pediatric): Secondary | ICD-10-CM | POA: Diagnosis not present

## 2018-08-09 DIAGNOSIS — E119 Type 2 diabetes mellitus without complications: Secondary | ICD-10-CM | POA: Diagnosis not present

## 2018-08-09 DIAGNOSIS — E785 Hyperlipidemia, unspecified: Secondary | ICD-10-CM | POA: Diagnosis not present

## 2018-08-09 DIAGNOSIS — F4310 Post-traumatic stress disorder, unspecified: Secondary | ICD-10-CM | POA: Diagnosis not present

## 2018-08-09 DIAGNOSIS — I10 Essential (primary) hypertension: Secondary | ICD-10-CM | POA: Diagnosis not present

## 2018-08-09 DIAGNOSIS — Z955 Presence of coronary angioplasty implant and graft: Secondary | ICD-10-CM | POA: Diagnosis not present

## 2018-08-09 DIAGNOSIS — I25110 Atherosclerotic heart disease of native coronary artery with unstable angina pectoris: Secondary | ICD-10-CM | POA: Diagnosis not present

## 2018-08-09 DIAGNOSIS — R072 Precordial pain: Secondary | ICD-10-CM | POA: Diagnosis not present

## 2018-08-16 DIAGNOSIS — I10 Essential (primary) hypertension: Secondary | ICD-10-CM | POA: Diagnosis not present

## 2018-08-16 DIAGNOSIS — E782 Mixed hyperlipidemia: Secondary | ICD-10-CM | POA: Diagnosis not present

## 2018-11-30 ENCOUNTER — Encounter: Payer: Self-pay | Admitting: Emergency Medicine

## 2018-11-30 ENCOUNTER — Other Ambulatory Visit: Payer: Self-pay

## 2018-11-30 ENCOUNTER — Ambulatory Visit
Admission: EM | Admit: 2018-11-30 | Discharge: 2018-11-30 | Disposition: A | Discharge status: Home / Self Care | Payer: Medicare Other | Attending: Family Medicine | Admitting: Family Medicine

## 2018-11-30 DIAGNOSIS — J441 Chronic obstructive pulmonary disease with (acute) exacerbation: Secondary | ICD-10-CM

## 2018-11-30 DIAGNOSIS — Z87891 Personal history of nicotine dependence: Secondary | ICD-10-CM | POA: Diagnosis not present

## 2018-11-30 MED ORDER — IPRATROPIUM BROMIDE 0.06 % NA SOLN: 2.0000 | Freq: Four times a day (QID) | NASAL | 0 refills | Status: DC | PRN

## 2018-11-30 MED ORDER — PREDNISONE 20 MG PO TABS: 40.0000 mg | ORAL_TABLET | Freq: Every day | ORAL | 0 refills | Status: AC

## 2018-11-30 MED ORDER — DOXYCYCLINE HYCLATE 100 MG PO CAPS: 100.0000 mg | ORAL_CAPSULE | Freq: Two times a day (BID) | ORAL | 0 refills | Status: DC

## 2018-11-30 NOTE — ED Provider Notes (Signed)
[facility]    CSN: [id number] Arrival date & time: 11/30/18  0818  History   Chief Complaint Chief Complaint  Patient presents with   Cough   Shortness of Breath    HPI  73 year old male with COPD presents with a 2-week history of cough, congestion, shortness of breath.  Patient reports that he has not traveled.  He has been experiencing productive cough, shortness of breath, chest congestion, nasal congestion for the past 2 weeks.  Patient also reports dry mouth.  He endorses compliance with his COPD medications.  No fever.  No reports of chills.  No known relieving factors.  No other associated symptoms.  No other complaints.  Past Medical History:  Diagnosis Date   Cancer ([place])    COPD (chronic obstructive pulmonary disease) ([place])    Diabetes mellitus without complication ([place])    Hypercholesteremia    Hypertension    PTSD (post-traumatic stress disorder)    Past Surgical History:  Procedure Laterality Date   ABDOMINAL SURGERY     CHOLECYSTECTOMY      Home Medications    Prior to Admission medications   Medication Sig Start Date End Date Taking? Authorizing Provider  albuterol (PROVENTIL HFA;VENTOLIN HFA) 108 (90 BASE) MCG/ACT inhaler Inhale into the lungs every 6 (six) hours as needed for wheezing or shortness of breath.   Yes [provider]  Ascorbic Acid (VITAMIN C) 100 MG tablet Take 100 mg by mouth daily.   Yes [provider]  aspirin 81 MG tablet Take 81 mg by mouth daily.   Yes [provider]  budesonide-formoterol (SYMBICORT) 160-4.5 MCG/ACT inhaler Inhale 2 puffs into the lungs 2 (two) times daily.   Yes [provider]  cholecalciferol (VITAMIN D) 1000 units tablet Take 25 Units by mouth 2 (two) times daily.   Yes [provider]  cyanocobalamin 100 MCG tablet Take 100 mcg by mouth daily.   Yes [provider]  DULoxetine (CYMBALTA) 30 MG capsule Take 30 mg by mouth daily.   Yes  [provider]  fluticasone (FLONASE) 50 MCG/ACT nasal spray Place 2 sprays into both nostrils daily. 07/14/18  Yes Melynda Ripple, MD  hydrochlorothiazide (HYDRODIURIL) 12.5 MG tablet Take 25 mg by mouth daily.    Yes [provider]  insulin glargine (LANTUS) 100 UNIT/ML injection Inject into the skin at bedtime.   Yes [provider]  insulin lispro (HUMALOG) 100 UNIT/ML injection Inject into the skin 3 (three) times daily before meals.   Yes [provider]  losartan (COZAAR) 25 MG tablet Take 25 mg by mouth daily.   Yes [provider]  metFORMIN (GLUCOPHAGE) 500 MG tablet Take by mouth 2 (two) times daily with a meal.   Yes [provider]  metoprolol tartrate (LOPRESSOR) 25 MG tablet metoprolol tartrate 25 mg tablet  Take 1 tablet twice a day by oral route.   Yes [provider]  Multiple Vitamin (MULTIVITAMIN) tablet Take 1 tablet by mouth daily.   Yes [provider]  pantoprazole (PROTONIX) 40 MG tablet Take 40 mg by mouth 2 (two) times daily.   Yes [provider]  prazosin (MINIPRESS) 5 MG capsule Take 5 mg by mouth at bedtime.   Yes [provider]  rosuvastatin (CRESTOR) 10 MG tablet Take 10 mg by mouth 2 (two) times daily.   Yes [provider]  tiotropium (SPIRIVA) 18 MCG inhalation capsule Place 18 mcg into inhaler and inhale daily.   Yes [provider]  traZODone (DESYREL) 100 MG tablet Take 200 mg by mouth 2 (two) times daily.   Yes [provider]  vitamin E 100 UNIT capsule Take by mouth daily.   Yes [provider]  doxycycline (VIBRAMYCIN) 100 MG capsule Take 1 capsule (100 mg total) by mouth 2 (two) times daily. 11/30/18   Thersa Salt G, DO  ipratropium (ATROVENT) 0.06 % nasal spray Place 2 sprays into both nostrils 4 (four) times daily as needed for rhinitis. 11/30/18   Coral Spikes, DO  predniSONE (DELTASONE) 20 MG tablet Take 2 tablets (40 mg  total) by mouth daily for 5 days. 11/30/18 12/05/18  Coral Spikes, DO    Family History Family History  Problem Relation Age of Onset   Cancer Mother    Lead poisoning Father     Social History Social History   Tobacco Use   Smoking status: Former Smoker    Last attempt to quit: 11/29/2000    Years since quitting: 18.0   Smokeless tobacco: Never Used  Substance Use Topics   Alcohol use: No   Drug use: Never     Allergies   Citalopram; Simvastatin; Iodinated diagnostic agents; and Penicillins   Review of Systems Review of Systems  Constitutional: Negative for fever.  HENT: Positive for congestion.   Respiratory: Positive for cough and shortness of breath.    Physical Exam Triage Vital Signs ED Triage Vitals  Enc Vitals Group     BP 11/30/18 0830 128/70     Pulse Rate 11/30/18 0830 78     Resp 11/30/18 0830 18     Temp 11/30/18 0830 97.7 F (36.5 C)     Temp Source 11/30/18 0830 Oral     SpO2 11/30/18 0830 95 %     Weight 11/30/18 0828 250 lb (113.4 kg)     Height 11/30/18 0828 6' (1.829 m)     Head Circumference --      Peak Flow --      Pain Score 11/30/18 0827 5     Pain Loc --      Pain Edu? --      Excl. in [place]? --    Updated Vital Signs BP 128/70 (BP Location: Left Arm)    Pulse 78    Temp 97.7 F (36.5 C) (Oral)    Resp 18    Ht 6' (1.829 m)    Wt 113.4 kg    SpO2 95%    BMI 33.91 kg/m   Visual Acuity Right Eye Distance:   Left Eye Distance:   Bilateral Distance:    Right Eye Near:   Left Eye Near:    Bilateral Near:     Physical Exam Vitals signs and nursing note reviewed.  Constitutional:      General: He is not in acute distress.    Appearance: He is obese.  HENT:     Head: Normocephalic and atraumatic.     Nose: No rhinorrhea.     Mouth/Throat:     Pharynx: Oropharynx is clear. No posterior oropharyngeal erythema.  Eyes:     General:        Right eye: No discharge.        Left eye: No discharge.     Conjunctiva/sclera:  Conjunctivae normal.  Cardiovascular:     Rate and Rhythm: Normal rate and regular rhythm.  Pulmonary:     Effort: Pulmonary effort is normal. No respiratory distress.     Comments: Scattered  wheezing. Neurological:     Mental Status: He is alert.  Psychiatric:        Mood and Affect: Mood normal.        Behavior: Behavior normal.    UC Treatments / Results  Labs (all labs ordered are listed, but only abnormal results are displayed) Labs Reviewed - No data to display  EKG None  Radiology No results found.  Procedures Procedures (including critical care time)  Medications Ordered in UC Medications - No data to display  Initial Impression / Assessment and Plan / UC Course  I have reviewed the triage vital signs and the nursing notes.  Pertinent labs & imaging results that were available during my care of the patient were reviewed by me and considered in my medical decision making (see chart for details).    73 year old male presents with a COPD exacerbation.  Treating with prednisone, doxycycline, Atrovent nasal spray.  Final Clinical Impressions(s) / UC Diagnoses   Final diagnoses:  COPD exacerbation [facility])     Discharge Instructions     Medications as prescribed.  Take care  Dr. Lacinda Axon    ED Prescriptions    Medication Sig Dispense Auth. Provider   ipratropium (ATROVENT) 0.06 % nasal spray Place 2 sprays into both nostrils 4 (four) times daily as needed for rhinitis. 15 mL Jade Burkard G, DO   predniSONE (DELTASONE) 20 MG tablet Take 2 tablets (40 mg total) by mouth daily for 5 days. 10 tablet Nykeem Citro G, DO   doxycycline (VIBRAMYCIN) 100 MG capsule Take 1 capsule (100 mg total) by mouth 2 (two) times daily. 14 capsule Coral Spikes, DO     Controlled Substance Prescriptions [place] Controlled Substance Registry consulted? Not Applicable   Coral Spikes, [place] 11/30/18 [phone]

## 2018-11-30 NOTE — Discharge Instructions (Signed)
Medications as prescribed. ° °Take care ° °Dr. Josyah Achor  °

## 2018-11-30 NOTE — ED Triage Notes (Signed)
Patient in today c/o cough, sob x 2 weeks. Patient denies fever or travel. Patient has a history of COPD.

## 2018-12-20 DIAGNOSIS — I2510 Atherosclerotic heart disease of native coronary artery without angina pectoris: Secondary | ICD-10-CM | POA: Diagnosis not present

## 2019-02-19 DIAGNOSIS — Z794 Long term (current) use of insulin: Secondary | ICD-10-CM | POA: Diagnosis not present

## 2019-02-19 DIAGNOSIS — I2510 Atherosclerotic heart disease of native coronary artery without angina pectoris: Secondary | ICD-10-CM | POA: Diagnosis not present

## 2019-02-19 DIAGNOSIS — I10 Essential (primary) hypertension: Secondary | ICD-10-CM | POA: Diagnosis not present

## 2019-02-19 DIAGNOSIS — E119 Type 2 diabetes mellitus without complications: Secondary | ICD-10-CM | POA: Diagnosis not present

## 2019-03-05 DIAGNOSIS — M5030 Other cervical disc degeneration, unspecified cervical region: Secondary | ICD-10-CM | POA: Diagnosis not present

## 2019-03-05 DIAGNOSIS — M7541 Impingement syndrome of right shoulder: Secondary | ICD-10-CM | POA: Diagnosis not present

## 2019-03-13 DIAGNOSIS — M542 Cervicalgia: Secondary | ICD-10-CM | POA: Diagnosis not present

## 2019-03-13 DIAGNOSIS — M5412 Radiculopathy, cervical region: Secondary | ICD-10-CM | POA: Diagnosis not present

## 2019-03-13 DIAGNOSIS — M25511 Pain in right shoulder: Secondary | ICD-10-CM | POA: Diagnosis not present

## 2019-03-16 DIAGNOSIS — M25511 Pain in right shoulder: Secondary | ICD-10-CM | POA: Diagnosis not present

## 2019-03-16 DIAGNOSIS — M542 Cervicalgia: Secondary | ICD-10-CM | POA: Diagnosis not present

## 2019-03-16 DIAGNOSIS — M5412 Radiculopathy, cervical region: Secondary | ICD-10-CM | POA: Diagnosis not present

## 2019-03-19 DIAGNOSIS — I2510 Atherosclerotic heart disease of native coronary artery without angina pectoris: Secondary | ICD-10-CM | POA: Diagnosis not present

## 2019-03-19 DIAGNOSIS — I10 Essential (primary) hypertension: Secondary | ICD-10-CM | POA: Diagnosis not present

## 2019-03-20 DIAGNOSIS — M542 Cervicalgia: Secondary | ICD-10-CM | POA: Diagnosis not present

## 2019-03-20 DIAGNOSIS — M5412 Radiculopathy, cervical region: Secondary | ICD-10-CM | POA: Diagnosis not present

## 2019-03-20 DIAGNOSIS — M25511 Pain in right shoulder: Secondary | ICD-10-CM | POA: Diagnosis not present

## 2019-03-20 DIAGNOSIS — M5030 Other cervical disc degeneration, unspecified cervical region: Secondary | ICD-10-CM | POA: Diagnosis not present

## 2019-03-23 DIAGNOSIS — M5412 Radiculopathy, cervical region: Secondary | ICD-10-CM | POA: Diagnosis not present

## 2019-03-23 DIAGNOSIS — M542 Cervicalgia: Secondary | ICD-10-CM | POA: Diagnosis not present

## 2019-03-23 DIAGNOSIS — M25511 Pain in right shoulder: Secondary | ICD-10-CM | POA: Diagnosis not present

## 2019-03-27 DIAGNOSIS — M25511 Pain in right shoulder: Secondary | ICD-10-CM | POA: Diagnosis not present

## 2019-03-27 DIAGNOSIS — M542 Cervicalgia: Secondary | ICD-10-CM | POA: Diagnosis not present

## 2019-03-27 DIAGNOSIS — M5412 Radiculopathy, cervical region: Secondary | ICD-10-CM | POA: Diagnosis not present

## 2019-03-30 DIAGNOSIS — M5412 Radiculopathy, cervical region: Secondary | ICD-10-CM | POA: Diagnosis not present

## 2019-03-30 DIAGNOSIS — M25511 Pain in right shoulder: Secondary | ICD-10-CM | POA: Diagnosis not present

## 2019-03-30 DIAGNOSIS — M542 Cervicalgia: Secondary | ICD-10-CM | POA: Diagnosis not present

## 2019-04-03 DIAGNOSIS — M25511 Pain in right shoulder: Secondary | ICD-10-CM | POA: Diagnosis not present

## 2019-04-03 DIAGNOSIS — M5412 Radiculopathy, cervical region: Secondary | ICD-10-CM | POA: Diagnosis not present

## 2019-04-03 DIAGNOSIS — M542 Cervicalgia: Secondary | ICD-10-CM | POA: Diagnosis not present

## 2019-04-06 DIAGNOSIS — M5412 Radiculopathy, cervical region: Secondary | ICD-10-CM | POA: Diagnosis not present

## 2019-04-06 DIAGNOSIS — M25511 Pain in right shoulder: Secondary | ICD-10-CM | POA: Diagnosis not present

## 2019-04-06 DIAGNOSIS — M542 Cervicalgia: Secondary | ICD-10-CM | POA: Diagnosis not present

## 2019-04-10 DIAGNOSIS — M5412 Radiculopathy, cervical region: Secondary | ICD-10-CM | POA: Diagnosis not present

## 2019-04-10 DIAGNOSIS — M542 Cervicalgia: Secondary | ICD-10-CM | POA: Diagnosis not present

## 2019-04-10 DIAGNOSIS — M25511 Pain in right shoulder: Secondary | ICD-10-CM | POA: Diagnosis not present

## 2019-04-13 DIAGNOSIS — M5412 Radiculopathy, cervical region: Secondary | ICD-10-CM | POA: Diagnosis not present

## 2019-04-13 DIAGNOSIS — M542 Cervicalgia: Secondary | ICD-10-CM | POA: Diagnosis not present

## 2019-04-13 DIAGNOSIS — M25511 Pain in right shoulder: Secondary | ICD-10-CM | POA: Diagnosis not present

## 2019-05-04 DIAGNOSIS — M5412 Radiculopathy, cervical region: Secondary | ICD-10-CM | POA: Diagnosis not present

## 2019-05-04 DIAGNOSIS — M542 Cervicalgia: Secondary | ICD-10-CM | POA: Diagnosis not present

## 2019-05-04 DIAGNOSIS — M25511 Pain in right shoulder: Secondary | ICD-10-CM | POA: Diagnosis not present

## 2019-05-08 DIAGNOSIS — M542 Cervicalgia: Secondary | ICD-10-CM | POA: Diagnosis not present

## 2019-05-08 DIAGNOSIS — M5412 Radiculopathy, cervical region: Secondary | ICD-10-CM | POA: Diagnosis not present

## 2019-05-08 DIAGNOSIS — M25511 Pain in right shoulder: Secondary | ICD-10-CM | POA: Diagnosis not present

## 2019-05-11 DIAGNOSIS — M542 Cervicalgia: Secondary | ICD-10-CM | POA: Diagnosis not present

## 2019-05-11 DIAGNOSIS — M5412 Radiculopathy, cervical region: Secondary | ICD-10-CM | POA: Diagnosis not present

## 2019-05-11 DIAGNOSIS — M25511 Pain in right shoulder: Secondary | ICD-10-CM | POA: Diagnosis not present

## 2019-05-18 DIAGNOSIS — M5412 Radiculopathy, cervical region: Secondary | ICD-10-CM | POA: Diagnosis not present

## 2019-05-18 DIAGNOSIS — M25511 Pain in right shoulder: Secondary | ICD-10-CM | POA: Diagnosis not present

## 2019-05-18 DIAGNOSIS — M542 Cervicalgia: Secondary | ICD-10-CM | POA: Diagnosis not present

## 2019-05-22 DIAGNOSIS — M5412 Radiculopathy, cervical region: Secondary | ICD-10-CM | POA: Diagnosis not present

## 2019-05-22 DIAGNOSIS — M542 Cervicalgia: Secondary | ICD-10-CM | POA: Diagnosis not present

## 2019-05-22 DIAGNOSIS — M25511 Pain in right shoulder: Secondary | ICD-10-CM | POA: Diagnosis not present

## 2019-06-15 DIAGNOSIS — G4733 Obstructive sleep apnea (adult) (pediatric): Secondary | ICD-10-CM | POA: Diagnosis not present

## 2019-06-15 DIAGNOSIS — I10 Essential (primary) hypertension: Secondary | ICD-10-CM | POA: Diagnosis not present

## 2019-06-15 DIAGNOSIS — E782 Mixed hyperlipidemia: Secondary | ICD-10-CM | POA: Diagnosis not present

## 2019-06-15 DIAGNOSIS — I2510 Atherosclerotic heart disease of native coronary artery without angina pectoris: Secondary | ICD-10-CM | POA: Diagnosis not present

## 2019-07-06 DIAGNOSIS — I2510 Atherosclerotic heart disease of native coronary artery without angina pectoris: Secondary | ICD-10-CM | POA: Diagnosis not present

## 2019-07-06 DIAGNOSIS — I10 Essential (primary) hypertension: Secondary | ICD-10-CM | POA: Diagnosis not present

## 2019-07-06 DIAGNOSIS — G4733 Obstructive sleep apnea (adult) (pediatric): Secondary | ICD-10-CM | POA: Diagnosis not present

## 2019-07-06 DIAGNOSIS — I69311 Memory deficit following cerebral infarction: Secondary | ICD-10-CM | POA: Diagnosis not present

## 2019-07-06 DIAGNOSIS — E782 Mixed hyperlipidemia: Secondary | ICD-10-CM | POA: Diagnosis not present

## 2019-07-16 DIAGNOSIS — B0221 Postherpetic geniculate ganglionitis: Secondary | ICD-10-CM | POA: Diagnosis not present

## 2019-07-16 DIAGNOSIS — H9201 Otalgia, right ear: Secondary | ICD-10-CM | POA: Diagnosis not present

## 2019-07-16 DIAGNOSIS — Z87891 Personal history of nicotine dependence: Secondary | ICD-10-CM | POA: Diagnosis not present

## 2019-07-27 DIAGNOSIS — H60501 Unspecified acute noninfective otitis externa, right ear: Secondary | ICD-10-CM | POA: Diagnosis not present

## 2019-07-27 DIAGNOSIS — B0221 Postherpetic geniculate ganglionitis: Secondary | ICD-10-CM | POA: Diagnosis not present

## 2019-07-27 DIAGNOSIS — H903 Sensorineural hearing loss, bilateral: Secondary | ICD-10-CM | POA: Diagnosis not present

## 2019-08-07 DIAGNOSIS — R2689 Other abnormalities of gait and mobility: Secondary | ICD-10-CM | POA: Diagnosis not present

## 2019-08-07 DIAGNOSIS — M792 Neuralgia and neuritis, unspecified: Secondary | ICD-10-CM | POA: Diagnosis not present

## 2019-08-07 DIAGNOSIS — B029 Zoster without complications: Secondary | ICD-10-CM | POA: Diagnosis not present

## 2019-08-07 DIAGNOSIS — H60501 Unspecified acute noninfective otitis externa, right ear: Secondary | ICD-10-CM | POA: Diagnosis not present

## 2019-08-20 DIAGNOSIS — B0222 Postherpetic trigeminal neuralgia: Secondary | ICD-10-CM | POA: Diagnosis not present

## 2019-09-09 ENCOUNTER — Encounter: Payer: Self-pay | Admitting: Emergency Medicine

## 2019-09-09 ENCOUNTER — Other Ambulatory Visit: Payer: Self-pay

## 2019-09-09 ENCOUNTER — Ambulatory Visit
Admission: EM | Admit: 2019-09-09 | Discharge: 2019-09-09 | Disposition: A | Discharge status: Home / Self Care | Payer: Medicare Other | Attending: Family Medicine | Admitting: Family Medicine

## 2019-09-09 DIAGNOSIS — G4700 Insomnia, unspecified: Secondary | ICD-10-CM | POA: Diagnosis not present

## 2019-09-09 DIAGNOSIS — Z760 Encounter for issue of repeat prescription: Secondary | ICD-10-CM

## 2019-09-09 MED ORDER — TRAZODONE HCL 300 MG PO TABS: 300.0000 mg | ORAL_TABLET | Freq: Every day | ORAL | 1 refills | Status: AC

## 2019-09-09 NOTE — Discharge Instructions (Signed)
Medication as prescribed.  Take care  Dr. Micah Galeno  

## 2019-09-09 NOTE — ED Triage Notes (Signed)
Patient states that he was prescribed Trazodone at the [facility] hospital and that they stopped him from taking it about 3 days ago.  Patient reports difficulty sleeping.  Patient c/o lower back pain that started 3 days ago.

## 2019-09-09 NOTE — ED Provider Notes (Signed)
[facility]    CSN: [id number] Arrival date & time: 09/09/19  0809  History   Chief Complaint Chief Complaint  Patient presents with  . Medication Refill   HPI   74 year old male presents with the above complaint.  Patient has been out of his trazodone for the past 3 to 4 days.  He states that the [place] told him to discontinue taking the medication as it was recalled recently.  I have reviewed the recall and it appears to be selection of 100 mg trazodone tablets.  He takes 300 mg at night to help with sleep.  Patient reports ongoing difficulty sleeping.  He also reports that he is sweating and is tremorous/jittery.  He attributes this to not having his medication.  Patient also reports recent onset of low back pain.  Patient is requesting trazodone or an alternative today.  No other complaints or concerns at this time.  PMH, Surgical Hx, Family Hx, Social History reviewed and updated as below.  Past Medical History:  Diagnosis Date  . Cancer ([place])   . COPD (chronic obstructive pulmonary disease) ([place])   . Diabetes mellitus without complication ([place])   . Hypercholesteremia   . Hypertension   . PTSD (post-traumatic stress disorder)    Past Surgical History:  Procedure Laterality Date  . ABDOMINAL SURGERY    . CHOLECYSTECTOMY     Home Medications    Prior to Admission medications   Medication Sig Start Date End Date Taking? Authorizing Provider  albuterol (PROVENTIL HFA;VENTOLIN HFA) 108 (90 BASE) MCG/ACT inhaler Inhale into the lungs every 6 (six) hours as needed for wheezing or shortness of breath.   Yes [provider]  Ascorbic Acid (VITAMIN C) 100 MG tablet Take 100 mg by mouth daily.   Yes [provider]  aspirin 81 MG tablet Take 81 mg by mouth daily.   Yes [provider]  budesonide-formoterol (SYMBICORT) 160-4.5 MCG/ACT inhaler Inhale 2 puffs into the lungs 2 (two) times daily.   Yes [provider]  cholecalciferol  (VITAMIN D) 1000 units tablet Take 25 Units by mouth 2 (two) times daily.   Yes [provider]  cyanocobalamin 100 MCG tablet Take 100 mcg by mouth daily.   Yes [provider]  DULoxetine (CYMBALTA) 30 MG capsule Take 30 mg by mouth daily.   Yes [provider]  fluticasone (FLONASE) 50 MCG/ACT nasal spray Place 2 sprays into both nostrils daily. 07/14/18  Yes Melynda Ripple, MD  hydrochlorothiazide (HYDRODIURIL) 12.5 MG tablet Take 25 mg by mouth daily.    Yes [provider]  insulin glargine (LANTUS) 100 UNIT/ML injection Inject into the skin at bedtime.   Yes [provider]  insulin lispro (HUMALOG) 100 UNIT/ML injection Inject into the skin 3 (three) times daily before meals.   Yes [provider]  ipratropium (ATROVENT) 0.06 % nasal spray Place 2 sprays into both nostrils 4 (four) times daily as needed for rhinitis. 11/30/18  Yes Binnie Droessler G, DO  losartan (COZAAR) 25 MG tablet Take 25 mg by mouth daily.   Yes [provider]  metFORMIN (GLUCOPHAGE) 500 MG tablet Take by mouth 2 (two) times daily with a meal.   Yes [provider]  metoprolol tartrate (LOPRESSOR) 25 MG tablet metoprolol tartrate 25 mg tablet  Take 1 tablet twice a day by oral route.   Yes [provider]  Multiple Vitamin (MULTIVITAMIN) tablet Take 1 tablet by mouth daily.   Yes [provider]  pantoprazole (PROTONIX) 40 MG tablet Take 40 mg by mouth 2 (two) times daily.   Yes [provider]  prazosin (MINIPRESS) 5 MG capsule Take 5 mg by mouth at bedtime.   Yes [provider]  rosuvastatin (CRESTOR) 10 MG tablet Take 10 mg by mouth 2 (two) times daily.   Yes [provider]  tiotropium (SPIRIVA) 18 MCG inhalation capsule Place 18 mcg into inhaler and inhale daily.   Yes [provider]  vitamin E 100 UNIT capsule Take by mouth daily.   Yes [provider]  trazodone (DESYREL) 300 MG  tablet Take 1 tablet (300 mg total) by mouth at bedtime. 09/09/19   Coral Spikes, DO    Family History Family History  Problem Relation Age of Onset  . Cancer Mother   . Lead poisoning Father     Social History Social History   Tobacco Use  . Smoking status: Former Smoker    Quit date: 11/29/2000    Years since quitting: 18.7  . Smokeless tobacco: Never Used  Substance Use Topics  . Alcohol use: No  . Drug use: Never     Allergies   Citalopram, Simvastatin, Iodinated diagnostic agents, and Penicillins   Review of Systems Review of Systems  Constitutional: Negative.   Musculoskeletal: Positive for back pain.  Psychiatric/Behavioral: Positive for sleep disturbance.   Physical Exam Triage Vital Signs ED Triage Vitals  Enc Vitals Group     BP 09/09/19 0822 138/87     Pulse Rate 09/09/19 0822 77     Resp 09/09/19 0822 16     Temp 09/09/19 0822 98.6 F (37 C)     Temp Source 09/09/19 0822 Oral     SpO2 09/09/19 0822 96 %     Weight 09/09/19 0818 240 lb (108.9 kg)     Height 09/09/19 0818 6' (1.829 m)     Head Circumference --      Peak Flow --      Pain Score 09/09/19 0818 4     Pain Loc --      Pain Edu? --      Excl. in [place]? --    Updated Vital Signs BP 138/87 (BP Location: Right Arm)   Pulse 77   Temp 98.6 F (37 C) (Oral)   Resp 16   Ht 6' (1.829 m)   Wt 108.9 kg   SpO2 96%   BMI 32.55 kg/m   Visual Acuity Right Eye Distance:   Left Eye Distance:   Bilateral Distance:    Right Eye Near:   Left Eye Near:    Bilateral Near:     Physical Exam Vitals and nursing note reviewed.  Constitutional:      General: He is not in acute distress.    Appearance: Normal appearance. He is not ill-appearing.  HENT:     Head: Normocephalic and atraumatic.  Eyes:     General:        Right eye: No discharge.        Left eye: No discharge.     Conjunctiva/sclera: Conjunctivae normal.  Cardiovascular:     Rate and Rhythm: Normal rate and regular rhythm.      Heart sounds: No murmur.  Pulmonary:     Effort: Pulmonary effort is normal.     Breath sounds: Normal breath sounds. No wheezing, rhonchi or rales.  Neurological:     General: No focal deficit present.     Mental Status: He is alert  and oriented to person, place, and time.  Psychiatric:        Mood and Affect: Mood normal.        Behavior: Behavior normal.    UC Treatments / Results  Labs (all labs ordered are listed, but only abnormal results are displayed) Labs Reviewed - No data to display  EKG   Radiology No results found.  Procedures Procedures (including critical care time)  Medications Ordered in UC Medications - No data to display  Initial Impression / Assessment and Plan / UC Course  I have reviewed the triage vital signs and the nursing notes.  Pertinent labs & imaging results that were available during my care of the patient were reviewed by me and considered in my medical decision making (see chart for details).    74 year old male presents with ongoing insomnia and not feeling well after not having his trazodone.  I have prescribed trazodone 300 mg tablets.  This does not appear to be part of the recall and should be safe to give.  Final Clinical Impressions(s) / UC Diagnoses   Final diagnoses:  Insomnia, unspecified type  Medication refill     Discharge Instructions     Medication as prescribed.  Take care  Dr. Lacinda Axon    ED Prescriptions    Medication Sig Dispense Auth. Provider   trazodone (DESYREL) 300 MG tablet Take 1 tablet (300 mg total) by mouth at bedtime. 90 tablet Thersa Salt G, DO     PDMP not reviewed this encounter.   Thersa Salt [place], [place] 09/09/19 [phone]

## 2019-09-21 ENCOUNTER — Inpatient Hospital Stay: Payer: Medicare Other

## 2019-09-21 ENCOUNTER — Other Ambulatory Visit: Payer: Self-pay

## 2019-09-21 ENCOUNTER — Emergency Department: Payer: Medicare Other

## 2019-09-21 ENCOUNTER — Observation Stay
Admission: EM | Admit: 2019-09-21 | Discharge: 2019-09-22 | Disposition: A | Discharge status: Home / Self Care | Payer: Medicare Other | Attending: Internal Medicine | Admitting: Internal Medicine

## 2019-09-21 ENCOUNTER — Encounter: Payer: Self-pay | Admitting: Emergency Medicine

## 2019-09-21 DIAGNOSIS — J90 Pleural effusion, not elsewhere classified: Secondary | ICD-10-CM | POA: Insufficient documentation

## 2019-09-21 DIAGNOSIS — Z880 Allergy status to penicillin: Secondary | ICD-10-CM | POA: Diagnosis not present

## 2019-09-21 DIAGNOSIS — Y92230 Patient room in hospital as the place of occurrence of the external cause: Secondary | ICD-10-CM | POA: Diagnosis not present

## 2019-09-21 DIAGNOSIS — Z20822 Contact with and (suspected) exposure to covid-19: Secondary | ICD-10-CM | POA: Insufficient documentation

## 2019-09-21 DIAGNOSIS — Z955 Presence of coronary angioplasty implant and graft: Secondary | ICD-10-CM | POA: Diagnosis not present

## 2019-09-21 DIAGNOSIS — Z7951 Long term (current) use of inhaled steroids: Secondary | ICD-10-CM | POA: Diagnosis not present

## 2019-09-21 DIAGNOSIS — E1169 Type 2 diabetes mellitus with other specified complication: Secondary | ICD-10-CM | POA: Diagnosis not present

## 2019-09-21 DIAGNOSIS — E119 Type 2 diabetes mellitus without complications: Secondary | ICD-10-CM | POA: Insufficient documentation

## 2019-09-21 DIAGNOSIS — R0789 Other chest pain: Secondary | ICD-10-CM | POA: Diagnosis not present

## 2019-09-21 DIAGNOSIS — G4733 Obstructive sleep apnea (adult) (pediatric): Secondary | ICD-10-CM | POA: Diagnosis not present

## 2019-09-21 DIAGNOSIS — I252 Old myocardial infarction: Secondary | ICD-10-CM | POA: Diagnosis not present

## 2019-09-21 DIAGNOSIS — I700 Atherosclerosis of aorta: Secondary | ICD-10-CM | POA: Insufficient documentation

## 2019-09-21 DIAGNOSIS — R079 Chest pain, unspecified: Secondary | ICD-10-CM | POA: Diagnosis present

## 2019-09-21 DIAGNOSIS — F329 Major depressive disorder, single episode, unspecified: Secondary | ICD-10-CM | POA: Diagnosis not present

## 2019-09-21 DIAGNOSIS — W19XXXA Unspecified fall, initial encounter: Secondary | ICD-10-CM | POA: Diagnosis not present

## 2019-09-21 DIAGNOSIS — Z888 Allergy status to other drugs, medicaments and biological substances status: Secondary | ICD-10-CM | POA: Insufficient documentation

## 2019-09-21 DIAGNOSIS — F4310 Post-traumatic stress disorder, unspecified: Secondary | ICD-10-CM | POA: Diagnosis not present

## 2019-09-21 DIAGNOSIS — I10 Essential (primary) hypertension: Secondary | ICD-10-CM | POA: Diagnosis not present

## 2019-09-21 DIAGNOSIS — Z8673 Personal history of transient ischemic attack (TIA), and cerebral infarction without residual deficits: Secondary | ICD-10-CM

## 2019-09-21 DIAGNOSIS — I959 Hypotension, unspecified: Secondary | ICD-10-CM | POA: Diagnosis not present

## 2019-09-21 DIAGNOSIS — Z7982 Long term (current) use of aspirin: Secondary | ICD-10-CM | POA: Diagnosis not present

## 2019-09-21 DIAGNOSIS — Z9989 Dependence on other enabling machines and devices: Secondary | ICD-10-CM

## 2019-09-21 DIAGNOSIS — F32A Depression, unspecified: Secondary | ICD-10-CM

## 2019-09-21 DIAGNOSIS — I200 Unstable angina: Secondary | ICD-10-CM

## 2019-09-21 DIAGNOSIS — K219 Gastro-esophageal reflux disease without esophagitis: Secondary | ICD-10-CM | POA: Diagnosis not present

## 2019-09-21 DIAGNOSIS — Z794 Long term (current) use of insulin: Secondary | ICD-10-CM | POA: Insufficient documentation

## 2019-09-21 DIAGNOSIS — J9811 Atelectasis: Secondary | ICD-10-CM | POA: Insufficient documentation

## 2019-09-21 DIAGNOSIS — J449 Chronic obstructive pulmonary disease, unspecified: Secondary | ICD-10-CM | POA: Diagnosis not present

## 2019-09-21 DIAGNOSIS — E7800 Pure hypercholesterolemia, unspecified: Secondary | ICD-10-CM | POA: Insufficient documentation

## 2019-09-21 DIAGNOSIS — E785 Hyperlipidemia, unspecified: Secondary | ICD-10-CM | POA: Insufficient documentation

## 2019-09-21 DIAGNOSIS — Z87891 Personal history of nicotine dependence: Secondary | ICD-10-CM | POA: Insufficient documentation

## 2019-09-21 DIAGNOSIS — Z79899 Other long term (current) drug therapy: Secondary | ICD-10-CM | POA: Diagnosis not present

## 2019-09-21 DIAGNOSIS — I25110 Atherosclerotic heart disease of native coronary artery with unstable angina pectoris: Secondary | ICD-10-CM | POA: Insufficient documentation

## 2019-09-21 DIAGNOSIS — J4489 Other specified chronic obstructive pulmonary disease: Secondary | ICD-10-CM

## 2019-09-21 LAB — CBC WITH DIFFERENTIAL/PLATELET
Abs Immature Granulocytes: 0.02 10*3/uL (ref 0.00–0.07)
Basophils Absolute: 0.1 10*3/uL (ref 0.0–0.1)
Basophils Relative: 1 %
Eosinophils Absolute: 1 10*3/uL — ABNORMAL HIGH (ref 0.0–0.5)
Eosinophils Relative: 11 %
HCT: 45.9 % (ref 39.0–52.0)
Hemoglobin: 15.7 g/dL (ref 13.0–17.0)
Immature Granulocytes: 0 %
Lymphocytes Relative: 11 %
Lymphs Abs: 1.1 10*3/uL (ref 0.7–4.0)
MCH: 30 pg (ref 26.0–34.0)
MCHC: 34.2 g/dL (ref 30.0–36.0)
MCV: 87.8 fL (ref 80.0–100.0)
Monocytes Absolute: 1 10*3/uL (ref 0.1–1.0)
Monocytes Relative: 10 %
Neutro Abs: 6.5 10*3/uL (ref 1.7–7.7)
Neutrophils Relative %: 67 %
Platelets: 189 10*3/uL (ref 150–400)
RBC: 5.23 MIL/uL (ref 4.22–5.81)
RDW: 13.3 % (ref 11.5–15.5)
WBC: 9.7 10*3/uL (ref 4.0–10.5)
nRBC: 0 % (ref 0.0–0.2)

## 2019-09-21 LAB — TROPONIN I (HIGH SENSITIVITY)
Troponin I (High Sensitivity): 5 ng/L (ref <18)
Troponin I (High Sensitivity): 5 ng/L (ref <18)
Troponin I (High Sensitivity): 4 ng/L (ref <18)
Troponin I (High Sensitivity): 4 ng/L (ref <18)

## 2019-09-21 LAB — COMPREHENSIVE METABOLIC PANEL
ALT: 27 U/L (ref 0–44)
AST: 37 U/L (ref 15–41)
Albumin: 4.2 g/dL (ref 3.5–5.0)
Alkaline Phosphatase: 53 U/L (ref 38–126)
Anion gap: 10 (ref 5–15)
BUN: 12 mg/dL (ref 8–23)
CO2: 25 mmol/L (ref 22–32)
Calcium: 9.2 mg/dL (ref 8.9–10.3)
Chloride: 99 mmol/L (ref 98–111)
Creatinine, Ser: 1.16 mg/dL (ref 0.61–1.24)
GFR calc Af Amer: >60 mL/min (ref >60)
GFR calc non Af Amer: >60 mL/min (ref >60)
Glucose, Bld: 197 mg/dL — ABNORMAL HIGH (ref 70–99)
Potassium: 4 mmol/L (ref 3.5–5.1)
Sodium: 134 mmol/L — ABNORMAL LOW (ref 135–145)
Total Bilirubin: 1.1 mg/dL (ref 0.3–1.2)
Total Protein: 7.5 g/dL (ref 6.5–8.1)

## 2019-09-21 LAB — APTT: aPTT: 28 seconds (ref 24–36)

## 2019-09-21 LAB — HEMOGLOBIN A1C
Hgb A1c MFr Bld: 8.5 % — ABNORMAL HIGH (ref 4.8–5.6)
Mean Plasma Glucose: 197.25 mg/dL

## 2019-09-21 LAB — GLUCOSE, CAPILLARY: Glucose-Capillary: 187 mg/dL — ABNORMAL HIGH (ref 70–99)

## 2019-09-21 LAB — LIPASE, BLOOD: Lipase: 39 U/L (ref 11–51)

## 2019-09-21 LAB — PROTIME-INR
INR: 1 (ref 0.8–1.2)
Prothrombin Time: 13.3 seconds (ref 11.4–15.2)

## 2019-09-21 MED ORDER — LOSARTAN POTASSIUM 25 MG PO TABS
25.0000 mg | ORAL_TABLET | Freq: Every day | ORAL | Status: DC
  Administered 2019-09-22: 25 mg via ORAL
  Filled 2019-09-21: qty 1

## 2019-09-21 MED ORDER — MOMETASONE FURO-FORMOTEROL FUM 200-5 MCG/ACT IN AERO
2.0000 | INHALATION_SPRAY | Freq: Two times a day (BID) | RESPIRATORY_TRACT | Status: DC
  Administered 2019-09-22 (×2): 2 via RESPIRATORY_TRACT
  Filled 2019-09-21: qty 8.8

## 2019-09-21 MED ORDER — ROSUVASTATIN CALCIUM 10 MG PO TABS: 10.0000 mg | ORAL_TABLET | Freq: Every day | ORAL | Status: DC

## 2019-09-21 MED ORDER — DULOXETINE HCL 30 MG PO CPEP
60.0000 mg | ORAL_CAPSULE | Freq: Every day | ORAL | Status: DC
  Administered 2019-09-22: 60 mg via ORAL
  Filled 2019-09-21: qty 2

## 2019-09-21 MED ORDER — ASPIRIN EC 81 MG PO TBEC
81.0000 mg | DELAYED_RELEASE_TABLET | Freq: Every day | ORAL | Status: DC
  Administered 2019-09-22: 81 mg via ORAL
  Filled 2019-09-21: qty 1

## 2019-09-21 MED ORDER — ENOXAPARIN SODIUM 40 MG/0.4ML ~~LOC~~ SOLN
40.0000 mg | SUBCUTANEOUS | Status: DC
  Administered 2019-09-21: 40 mg via SUBCUTANEOUS
  Filled 2019-09-21: qty 0.4

## 2019-09-21 MED ORDER — IPRATROPIUM-ALBUTEROL 0.5-2.5 (3) MG/3ML IN SOLN: 3.0000 mL | RESPIRATORY_TRACT | Status: DC | PRN

## 2019-09-21 MED ORDER — MORPHINE SULFATE (PF) 2 MG/ML IV SOLN
1.0000 mg | INTRAVENOUS | Status: DC | PRN
  Administered 2019-09-21 (×2): 1 mg via INTRAVENOUS
  Filled 2019-09-21 (×2): qty 1

## 2019-09-21 MED ORDER — HYDROCHLOROTHIAZIDE 25 MG PO TABS: 25.0000 mg | ORAL_TABLET | Freq: Every day | ORAL | Status: DC

## 2019-09-21 MED ORDER — INSULIN ASPART 100 UNIT/ML ~~LOC~~ SOLN
0.0000 [IU] | Freq: Three times a day (TID) | SUBCUTANEOUS | Status: DC
  Administered 2019-09-22: 12:00:00 11 [IU] via SUBCUTANEOUS
  Administered 2019-09-22: 09:00:00 7 [IU] via SUBCUTANEOUS
  Filled 2019-09-21 (×2): qty 1

## 2019-09-21 MED ORDER — MORPHINE SULFATE (PF) 2 MG/ML IV SOLN
2.0000 mg | Freq: Once | INTRAVENOUS | Status: AC
  Administered 2019-09-21: 2 mg via INTRAVENOUS

## 2019-09-21 MED ORDER — TRAZODONE HCL 100 MG PO TABS: 300.0000 mg | ORAL_TABLET | Freq: Every day | ORAL | Status: DC

## 2019-09-21 MED ORDER — MORPHINE SULFATE (PF) 4 MG/ML IV SOLN
4.0000 mg | Freq: Once | INTRAVENOUS | Status: DC
  Filled 2019-09-21: qty 1

## 2019-09-21 MED ORDER — MORPHINE SULFATE (PF) 2 MG/ML IV SOLN
INTRAVENOUS | Status: AC
  Filled 2019-09-21: qty 1

## 2019-09-21 MED ORDER — POTASSIUM CHLORIDE CRYS ER 10 MEQ PO TBCR: 10.0000 meq | EXTENDED_RELEASE_TABLET | Freq: Every day | ORAL | Status: DC

## 2019-09-21 MED ORDER — ISOSORBIDE DINITRATE 30 MG PO TABS
30.0000 mg | ORAL_TABLET | Freq: Every day | ORAL | Status: DC
  Administered 2019-09-22: 30 mg via ORAL
  Filled 2019-09-21: qty 1

## 2019-09-21 MED ORDER — NITROGLYCERIN 2 % TD OINT
1.0000 [in_us] | TOPICAL_OINTMENT | Freq: Four times a day (QID) | TRANSDERMAL | Status: DC
  Administered 2019-09-21 – 2019-09-22 (×3): 1 [in_us] via TOPICAL
  Filled 2019-09-21 (×3): qty 1

## 2019-09-21 MED ORDER — METOPROLOL TARTRATE 50 MG PO TABS
50.0000 mg | ORAL_TABLET | Freq: Two times a day (BID) | ORAL | Status: DC
  Administered 2019-09-22 (×2): 50 mg via ORAL
  Filled 2019-09-21 (×2): qty 1

## 2019-09-21 MED ORDER — ONDANSETRON HCL 4 MG/2ML IJ SOLN
4.0000 mg | Freq: Once | INTRAMUSCULAR | Status: AC
  Administered 2019-09-21: 15:00:00 4 mg via INTRAVENOUS
  Filled 2019-09-21: qty 2

## 2019-09-21 MED ORDER — PANTOPRAZOLE SODIUM 40 MG PO TBEC: 40.0000 mg | DELAYED_RELEASE_TABLET | Freq: Two times a day (BID) | ORAL | Status: DC

## 2019-09-21 MED ORDER — PRAZOSIN HCL 5 MG PO CAPS
5.0000 mg | ORAL_CAPSULE | Freq: Two times a day (BID) | ORAL | Status: DC
  Administered 2019-09-22: 5 mg via ORAL
  Filled 2019-09-21 (×3): qty 1

## 2019-09-21 MED ORDER — ONDANSETRON HCL 4 MG/2ML IJ SOLN
4.0000 mg | Freq: Four times a day (QID) | INTRAMUSCULAR | Status: DC | PRN
  Administered 2019-09-21 – 2019-09-22 (×2): 4 mg via INTRAVENOUS
  Filled 2019-09-21 (×2): qty 2

## 2019-09-21 MED ORDER — ACETAMINOPHEN 325 MG PO TABS: 650.0000 mg | ORAL_TABLET | ORAL | Status: DC | PRN

## 2019-09-21 MED ORDER — VITAMIN B-12 100 MCG PO TABS
100.0000 ug | ORAL_TABLET | Freq: Every day | ORAL | Status: DC
  Administered 2019-09-22: 100 ug via ORAL
  Filled 2019-09-21: qty 1

## 2019-09-21 NOTE — H&P (Signed)
History and Physical    Tim Dean [id number] DOB: 12/24/1945 DOA: 09/21/2019  PCP: seen at the [facility] Patient coming from: Home  I have personally briefly reviewed patient's old medical records in [place]  Chief Complaint: chest pain   HPI: Tim Dean is a 74 y.o. male with medical history significant of CAD s/p DES 2012, CVA with residual left sided weakness, COPD, hypertension, OSA on Bipap, hx of colon cancer, insulin-dependent Type 2 diabetes, HLD, PTSD who presents with acute onset of mid-sternal chest pain.  Symptoms started about 6 hours ago. He had finished feeding birds outside and had just sat down. Felt sudden mid-sternal chest pain and took 3 SQ nitro without relieve. Pain is consult and dull and achy. He continued to have intermittently worsening chest pain here in the ER and it radiated to both arms and up his neck and back of his head.  Has some shortness of breath but no worse than usual for his COPD. Had worsening cough and sputum production this morning.  Has nausea but no vomiting, diarrhea or abdominal pain.   Patient was a former smoker and drinker but denies it currently. No illicit drug use.   He is feeling depressed and endorse that he had thought about "shooting myself to end it." When asked if he owned a gun or has a plan patient just asked "why do I care?"   ED Course: He was afebrile, normotensive on room air.  CBC unremarkable. Na of 134, K of 4, glucose of 197, creatinine of 1.16. Troponin of 5.  Had repeat EKG in the ED showing minimal ST elevation to inferior leads.   CXR with small left pleural effusion and areas of atelectasis and/or consolidation in the left lower lobe.   Review of Systems:  Constitutional: No Weight Change, No Fever ENT/Mouth: No sore throat, No Rhinorrhea Eyes: No Eye Pain, No Vision Changes Cardiovascular: + Chest Pain, + SOB, No Dyspnea on Exertion Respiratory: No Cough, No Sputum Gastrointestinal: No Nausea, No  Vomiting, No Diarrhea, No Constipation, No Pain Genitourinary: no Urinary Incontinence Musculoskeletal: No Arthralgias, No Myalgias Skin: No Skin Lesions, No Pruritus, Neuro: no Weakness, No Numbness Psych: No Anxiety/Panic, + Depression, no decrease appetite Heme/Lymph: No Bruising, No Bleeding  Past Medical History:  Diagnosis Date  . Cancer ([place])   . COPD (chronic obstructive pulmonary disease) ([place])   . Diabetes mellitus without complication ([place])   . Hypercholesteremia   . Hypertension   . PTSD (post-traumatic stress disorder)     Past Surgical History:  Procedure Laterality Date  . ABDOMINAL SURGERY    . CHOLECYSTECTOMY       reports that he quit smoking about 18 years ago. He has never used smokeless tobacco. He reports that he does not drink alcohol or use drugs.  Allergies  Allergen Reactions  . Citalopram     Other reaction(s): Muscle Pain  . Simvastatin     Other reaction(s): Muscle Pain  . Iodinated Diagnostic Agents Nausea Only    Other reaction(s): Dizziness  . Penicillins Rash    Family History  Problem Relation Age of Onset  . Cancer Mother   . Lead poisoning Father      Prior to Admission medications   Medication Sig Start Date End Date Taking? Authorizing Provider  albuterol (PROVENTIL HFA;VENTOLIN HFA) 108 (90 BASE) MCG/ACT inhaler Inhale into the lungs every 6 (six) hours as needed for wheezing or shortness of breath.    [provider]  Ascorbic Acid (VITAMIN C) 100 MG tablet Take 100 mg by mouth daily.    [provider]  aspirin 81 MG tablet Take 81 mg by mouth daily.    [provider]  budesonide-formoterol (SYMBICORT) 160-4.5 MCG/ACT inhaler Inhale 2 puffs into the lungs 2 (two) times daily.    [provider]  cholecalciferol (VITAMIN D) 1000 units tablet Take 25 Units by mouth 2 (two) times daily.    [provider]  cyanocobalamin 100 MCG tablet Take 100 mcg by mouth daily.    [provider]  DULoxetine (CYMBALTA) 30 MG capsule Take 30 mg by mouth daily.    [provider]  fluticasone (FLONASE) 50 MCG/ACT nasal spray Place 2 sprays into both nostrils daily. 07/14/18   Melynda Ripple, MD  hydrochlorothiazide (HYDRODIURIL) 12.5 MG tablet Take 25 mg by mouth daily.     [provider]  insulin glargine (LANTUS) 100 UNIT/ML injection Inject into the skin at bedtime.    [provider]  insulin lispro (HUMALOG) 100 UNIT/ML injection Inject into the skin 3 (three) times daily before meals.    [provider]  ipratropium (ATROVENT) 0.06 % nasal spray Place 2 sprays into both nostrils 4 (four) times daily as needed for rhinitis. 11/30/18   Coral Spikes, DO  losartan (COZAAR) 25 MG tablet Take 25 mg by mouth daily.    [provider]  metFORMIN (GLUCOPHAGE) 500 MG tablet Take by mouth 2 (two) times daily with a meal.    [provider]  metoprolol tartrate (LOPRESSOR) 25 MG tablet metoprolol tartrate 25 mg tablet  Take 1 tablet twice a day by oral route.    [provider]  Multiple Vitamin (MULTIVITAMIN) tablet Take 1 tablet by mouth daily.    [provider]  pantoprazole (PROTONIX) 40 MG tablet Take 40 mg by mouth 2 (two) times daily.    [provider]  prazosin (MINIPRESS) 5 MG capsule Take 5 mg by mouth at bedtime.    [provider]  rosuvastatin (CRESTOR) 10 MG tablet Take 10 mg by mouth 2 (two) times daily.    [provider]  tiotropium (SPIRIVA) 18 MCG inhalation capsule Place 18 mcg into inhaler and inhale daily.    [provider]  trazodone (DESYREL) 300 MG tablet Take 1 tablet (300 mg total) by mouth at bedtime. 09/09/19   Coral Spikes, DO  vitamin E 100 UNIT capsule Take by mouth daily.    [provider]    Physical Exam: Vitals:   09/21/19 1341 09/21/19 1430 09/21/19 1500 09/21/19 1530  BP: 130/81 130/84 (!) 136/92 134/85  Pulse: 72 72 74  72  Resp: 18 15 15 12   Temp: 98.7 F (37.1 C)     TempSrc: Oral     SpO2: 96% 96% 98% 98%  Weight:      Height:        Constitutional: NAD obese male sitting upright in bed Vitals:   09/21/19 1341 09/21/19 1430 09/21/19 1500 09/21/19 1530  BP: 130/81 130/84 (!) 136/92 134/85  Pulse: 72 72 74 72  Resp: 18 15 15 12   Temp: 98.7 F (37.1 C)     TempSrc: Oral     SpO2: 96% 96% 98% 98%  Weight:      Height:       Eyes: PERRL, lids and conjunctivae normal ENMT: Mucous membranes are moist.  Neck: normal, supple Respiratory: crackles bibasilar and left upper lobe  but no wheezing. Normal respiratory effort on room air. No accessory muscle use.  Cardiovascular: Regular rate and rhythm, no murmurs / rubs / gallops. No extremity edema. Pain with palpation of the lower mid-sternal chest wall. Endorse ongoing chest pain during exam. Abdomen: no tenderness, no masses palpated.  Bowel sounds positive.  Musculoskeletal: no clubbing / cyanosis. No joint deformity upper and lower extremities. Good ROM, no contractures. Normal muscle tone.  Skin: no rashes, lesions, ulcers. No induration Neurologic: CN 2-12 grossly intact. Sensation intact. Strength 5/5 in all 4.  Psychiatric: Normal judgment and insight. Alert and oriented x 3. Depressed mood.   Labs on Admission: I have personally reviewed following labs and imaging studies  CBC: Recent Labs  Lab 09/21/19 1348  WBC 9.7  NEUTROABS 6.5  HGB 15.7  HCT 45.9  MCV 87.8  PLT 99991111   Basic Metabolic Panel: Recent Labs  Lab 09/21/19 1348  NA 134*  K 4.0  CL 99  CO2 25  GLUCOSE 197*  BUN 12  CREATININE 1.16  CALCIUM 9.2   GFR: Estimated Creatinine Clearance: 70.1 mL/min (by C-G formula based on SCr of 1.16 mg/dL). Liver Function Tests: Recent Labs  Lab 09/21/19 1348  AST 37  ALT 27  ALKPHOS 53  BILITOT 1.1  PROT 7.5  ALBUMIN 4.2   No results for input(s): LIPASE, AMYLASE in the last 168 hours. No results for input(s):  AMMONIA in the last 168 hours. Coagulation Profile: Recent Labs  Lab 09/21/19 1348  INR 1.0   Cardiac Enzymes: No results for input(s): CKTOTAL, CKMB, CKMBINDEX, TROPONINI in the last 168 hours. BNP (last 3 results) No results for input(s): PROBNP in the last 8760 hours. HbA1C: No results for input(s): HGBA1C in the last 72 hours. CBG: No results for input(s): GLUCAP in the last 168 hours. Lipid Profile: No results for input(s): CHOL, HDL, LDLCALC, TRIG, CHOLHDL, LDLDIRECT in the last 72 hours. Thyroid Function Tests: No results for input(s): TSH, T4TOTAL, FREET4, T3FREE, THYROIDAB in the last 72 hours. Anemia Panel: No results for input(s): VITAMINB12, FOLATE, FERRITIN, TIBC, IRON, RETICCTPCT in the last 72 hours. Urine analysis:    Component Value Date/Time   COLORURINE YELLOW 08/21/2015 0902   APPEARANCEUR CLEAR 08/21/2015 0902   APPEARANCEUR CLEAR 05/11/2014 0819   LABSPEC 1.015 08/21/2015 0902   LABSPEC 1.010 05/11/2014 0819   PHURINE 7.0 08/21/2015 0902   GLUCOSEU NEGATIVE 08/21/2015 0902   GLUCOSEU 250 mg/dL 05/11/2014 0819   HGBUR NEGATIVE 08/21/2015 0902   BILIRUBINUR NEGATIVE 08/21/2015 0902   BILIRUBINUR MODERATE 05/11/2014 0819   KETONESUR NEGATIVE 08/21/2015 0902   PROTEINUR NEGATIVE 08/21/2015 0902   NITRITE NEGATIVE 08/21/2015 0902   LEUKOCYTESUR NEGATIVE 08/21/2015 0902   LEUKOCYTESUR NEGATIVE 05/11/2014 0819    Radiological Exams on Admission: DG Chest Port 1 View  Result Date: 09/21/2019 CLINICAL DATA:  74 year old male former smoker presenting with midsternal chest pain. EXAM: PORTABLE CHEST 1 VIEW COMPARISON:  Chest x-ray 07/14/2018. FINDINGS: Lung volumes are low. Small left pleural effusion. Ill-defined opacity at the left base may reflect atelectasis and/or consolidation. Patchy areas of interstitial prominence elsewhere in the lungs bilaterally. No right pleural effusion. No pneumothorax. No evidence of pulmonary edema. Heart size is normal. Upper  mediastinal contours are within normal limits. Aortic atherosclerosis. IMPRESSION: 1. Low lung volumes with small left pleural effusion and areas of atelectasis and/or consolidation in the left lower lobe. 2. Aortic atherosclerosis. Electronically Signed   By: Vinnie Langton M.D.   On: 09/21/2019 14:14  EKG: Independently reviewed.   Assessment/Plan  Chest pain/unstable angina -Follows with [facility] cardiology outpatient -DES to [place] 2012 -Catherization in 2019 for angina and found to have patent stent to OM1, no change to in LCx or RCA disease. Progression of apical LAD disease and distal LAD - medical management pursued by [facility] in Oct 2019 -I  Discussed Case with cardiology Dr. Nehemiah Massed who recommended CT chest to rule out other pathology and will evaluate pt.  -CT non-contrast obtained given hx of contrast allergy and there is low suspicion for PE or aortic dissection.  CT chest showed diffuse CAD, bibasilar ground glass opacities reflecting atelectasis vs infiltrates/pneumonia. Doubt pneumonia because he is afebrile and has no leukocytosis.  - Will start heparin gtt now due to continuous chest pain.  -If any interventional is needed, pt only wants it done at [facility] to trend troponin  - Continue Imdur  HTN  Continue HCTZ, losartan, metoprolol, prazosin  COPD No acute exacerbation Continue bronchodilators  OSA bipap  Type 2 diabetes  Normally takes 50 units Lantus qHS Continue with resistant SSI  HLD  Continue statin  Hx of CVA Continue aspirin  GERD  Continue PPI   Depression Continue Cymbalta, trazodone  DVT prophylaxis: Heparin gtt Code Status: Full Family Communication: Plan discussed with patient at bedside  disposition Plan: Home with at least 2 midnight stays  Consults called: Cardiology Admission status: inpatient   Calianne Larue T Macrae Wiegman DO [facility]   If 7PM-7AM, please contact night-coverage [email or web address] Password [facility]  09/21/2019, 3:53 PM

## 2019-09-21 NOTE — ED Notes (Signed)
Pt given urinal and water, ok by dr Joan Mayans

## 2019-09-21 NOTE — Progress Notes (Signed)
Called by bedside RN for patient with continued chest pain after arrival to nursing unt that was not relieved by previously ordered nitro paste and morphine.   Bedside eval, paeint reports now mild relieve of chest pain described as left substernal radiating down left arm.  - now 4/10 EKG normal sinus rhythm Labs reviewed Telemetry with reported PVC's.  Patient reports only has chest pain treated with nitro very rarely at home He does have chronic cough from COPD - denies acute change Chest soreness bilaterally with palpation equally Chest CT ordered previously, will follow up

## 2019-09-21 NOTE — Progress Notes (Signed)
Pt returned from CT °

## 2019-09-21 NOTE — Progress Notes (Signed)
Pt transported to CT at this time.

## 2019-09-21 NOTE — ED Provider Notes (Signed)
[facility] Emergency Department Provider Note  ____________________________________________   First MD Initiated Contact with Patient 09/21/19 1401     (approximate)  I have reviewed the triage vital signs and the nursing notes.  History  Chief Complaint Chest Pain    HPI Tim Dean is a 74 y.o. male with history of CAD s/p stenting, COPD, DM, HTN, HLD presents to the emergency department for sudden onset of substernal/epigastric chest pain while at rest.  He describes the pain as a dullness.  Moderate in severity.  Radiates towards the left arm.  Associated with nausea and shortness of breath.  States this feels similar to prior MI, but perhaps less severe. Took NTG x 2 as well as full dose ASA. Pain improved somewhat with NTG. No aggravating components. While in the ED he does have intermittent episodes of acute worsening of the pain.    Past Medical Hx Past Medical History:  Diagnosis Date  . Cancer ([place])   . COPD (chronic obstructive pulmonary disease) ([place])   . Diabetes mellitus without complication ([place])   . Hypercholesteremia   . Hypertension   . PTSD (post-traumatic stress disorder)     Problem List There are no problems to display for this patient.   Past Surgical Hx Past Surgical History:  Procedure Laterality Date  . ABDOMINAL SURGERY    . CHOLECYSTECTOMY      Medications Prior to Admission medications   Medication Sig Start Date End Date Taking? Authorizing Provider  albuterol (PROVENTIL HFA;VENTOLIN HFA) 108 (90 BASE) MCG/ACT inhaler Inhale into the lungs every 6 (six) hours as needed for wheezing or shortness of breath.    [provider]  Ascorbic Acid (VITAMIN C) 100 MG tablet Take 100 mg by mouth daily.    [provider]  aspirin 81 MG tablet Take 81 mg by mouth daily.    [provider]  budesonide-formoterol (SYMBICORT) 160-4.5 MCG/ACT inhaler Inhale 2 puffs into the lungs 2 (two) times daily.     [provider]  cholecalciferol (VITAMIN D) 1000 units tablet Take 25 Units by mouth 2 (two) times daily.    [provider]  cyanocobalamin 100 MCG tablet Take 100 mcg by mouth daily.    [provider]  DULoxetine (CYMBALTA) 30 MG capsule Take 30 mg by mouth daily.    [provider]  fluticasone (FLONASE) 50 MCG/ACT nasal spray Place 2 sprays into both nostrils daily. 07/14/18   Melynda Ripple, MD  hydrochlorothiazide (HYDRODIURIL) 12.5 MG tablet Take 25 mg by mouth daily.     [provider]  insulin glargine (LANTUS) 100 UNIT/ML injection Inject into the skin at bedtime.    [provider]  insulin lispro (HUMALOG) 100 UNIT/ML injection Inject into the skin 3 (three) times daily before meals.    [provider]  ipratropium (ATROVENT) 0.06 % nasal spray Place 2 sprays into both nostrils 4 (four) times daily as needed for rhinitis. 11/30/18   Coral Spikes, DO  losartan (COZAAR) 25 MG tablet Take 25 mg by mouth daily.    [provider]  metFORMIN (GLUCOPHAGE) 500 MG tablet Take by mouth 2 (two) times daily with a meal.    [provider]  metoprolol tartrate (LOPRESSOR) 25 MG tablet metoprolol tartrate 25 mg tablet  Take 1 tablet twice a day by oral route.    [provider]  Multiple Vitamin (MULTIVITAMIN) tablet Take 1 tablet by mouth daily.    [provider]  pantoprazole (PROTONIX) 40 MG tablet Take 40 mg by mouth 2 (two) times daily.    [provider]  prazosin (MINIPRESS) 5 MG capsule Take 5 mg by mouth at bedtime.    [provider]  rosuvastatin (CRESTOR) 10 MG tablet Take 10 mg by mouth 2 (two) times daily.    [provider]  tiotropium (SPIRIVA) 18 MCG inhalation capsule Place 18 mcg into inhaler and inhale daily.    [provider]  trazodone (DESYREL) 300 MG tablet Take 1 tablet (300 mg total) by mouth at bedtime. 09/09/19   Coral Spikes, DO    vitamin E 100 UNIT capsule Take by mouth daily.    [provider]    Allergies Citalopram, Simvastatin, Iodinated diagnostic agents, and Penicillins  Family Hx Family History  Problem Relation Age of Onset  . Cancer Mother   . Lead poisoning Father     Social Hx Social History   Tobacco Use  . Smoking status: Former Smoker    Quit date: 11/29/2000    Years since quitting: 18.8  . Smokeless tobacco: Never Used  Substance Use Topics  . Alcohol use: No  . Drug use: Never     Review of Systems  Constitutional: Negative for fever, chills. Eyes: Negative for visual changes. ENT: Negative for sore throat. Cardiovascular: + for chest pain. Respiratory: + for shortness of breath. Gastrointestinal: + for nausea.  Genitourinary: Negative for dysuria. Musculoskeletal: Negative for leg swelling. Skin: Negative for rash. Neurological: Negative for for headaches.   Physical Exam  Vital Signs: ED Triage Vitals  Enc Vitals Group     BP 09/21/19 1341 130/81     Pulse Rate 09/21/19 1341 72     Resp 09/21/19 1341 18     Temp 09/21/19 1341 98.7 F (37.1 C)     Temp Source 09/21/19 1341 Oral     SpO2 09/21/19 1341 96 %     Weight 09/21/19 1338 240 lb (108.9 kg)     Height 09/21/19 1338 6' (1.829 m)     Head Circumference --      Peak Flow --      Pain Score 09/21/19 1338 4     Pain Loc --      Pain Edu? --      Excl. in [place]? --     Constitutional: Alert and oriented.  Head: Normocephalic. Atraumatic. Eyes: Conjunctivae clear. Sclera anicteric. Nose: No congestion. No rhinorrhea. Mouth/Throat: Wearing mask.  Neck: No stridor.   Cardiovascular: Normal rate, regular rhythm. Extremities well perfused. Equal and symmetric radial and PT pulses.  Respiratory: Normal respiratory effort.  Lungs CTAB. Gastrointestinal: Soft. Non-tender. Non-distended.  Musculoskeletal: No lower extremity edema. No deformities. Neurologic:  Normal speech and language. No gross focal  neurologic deficits are appreciated.  Skin: Skin is warm, dry and intact. No rash noted. Psychiatric: Mood and affect are appropriate for situation.  EKG  Personally reviewed.  Multiple repeat EKGs obtained for comparison.  Initial EKG obtained 13:37 Rate: 74 Rhythm: sinus Axis: normal Intervals: WNL Minimal ST changes in inferior leads, PVC Does not meet STEMI criteria  13:39 Rate: within normal limits Rhythm: sinus Axis: normal Intervals: within normal limits Continued minimal ST changes inferior No STEMI criteria  13:47 Rate: within normal limits Rhythm: sinus Axis: normal Intervals: within normal limits Continued minimal ST changes inferior No STEMI criteria  13:57 Rate: within normal limits Rhythm: sinus Axis: normal Intervals: within normal limits Continued minimal ST changes  inferior No STEMI criteria    Radiology  CXR: IMPRESSION:  1. Low lung volumes with small left pleural effusion and areas of  atelectasis and/or consolidation in the left lower lobe.  2. Aortic atherosclerosis.    Procedures  Procedure(s) performed (including critical care):  Procedures   Initial Impression / Assessment and Plan / ED Course  74 y.o. male with hx of CAD who presents to the ED for chest pain, as above.  Ddx: ACS, unstable angina, GERD, pancreatitis.  Doubt dissection given no other associated neurological signs/symptoms, equal and symmetric distal pulses.  Has already had ASA. Will obtain labs, EKG.   Initial troponin negative.  EKGs as above.  He continues to have ongoing pain/nausea.  Will give dose of morphine, Zofran.  Concern for ongoing unstable angina, will plan for admission.  Patient is agreeable.   Final Clinical Impression(s) / ED Diagnosis  Final diagnoses:  Chest pain  Unstable angina [facility])       Note:  This document was prepared using Dragon voice recognition software and may include unintentional dictation errors.   Lilia Pro., MD 09/21/19 [phone]

## 2019-09-21 NOTE — ED Notes (Signed)
As RN in room pt becoming nauseated.  Severe pain into left arm.  When pt arrived pain was only substernal.  + cardiac hx. Dr Kerman Passey called to room to see pt.

## 2019-09-21 NOTE — ED Notes (Signed)
While RN was in room c/o acute worsening of pain to left arm.  Pt seems to be getting worse. Multiple EKG done. Dr Joan Mayans aware

## 2019-09-21 NOTE — Progress Notes (Signed)
Patient admitted to 2A 256 at 63. Dr. Ileene Musa, DO notified of patient's arrival and his complaints of 6/10 left chest pain not caused by any precipitating factors. Constant pain unrelieved by rest. DO notified CT called and will be going down shortly. Dr. Flossie Buffy ordered morphine and nitro paste and consulted cardiology. Considering heparin gtt. EKG recommended.

## 2019-09-21 NOTE — ED Notes (Signed)
Pt wanting his inhaler and to eat.  Pt does not care to wait for cardiology and will refuse a cath here and only want it to be done at [facility]. MD notified.

## 2019-09-21 NOTE — ED Triage Notes (Signed)
Here for acute onset substernal chest pain today.  NTG X 2 at home, ASA 324 mg. Pain eased with NTG.  Pain 4/10 at this time.

## 2019-09-22 ENCOUNTER — Inpatient Hospital Stay: Payer: Medicare Other

## 2019-09-22 DIAGNOSIS — R079 Chest pain, unspecified: Secondary | ICD-10-CM | POA: Diagnosis not present

## 2019-09-22 DIAGNOSIS — I25110 Atherosclerotic heart disease of native coronary artery with unstable angina pectoris: Secondary | ICD-10-CM | POA: Diagnosis not present

## 2019-09-22 DIAGNOSIS — I10 Essential (primary) hypertension: Secondary | ICD-10-CM | POA: Diagnosis not present

## 2019-09-22 DIAGNOSIS — S0990XA Unspecified injury of head, initial encounter: Secondary | ICD-10-CM | POA: Diagnosis not present

## 2019-09-22 DIAGNOSIS — R0789 Other chest pain: Secondary | ICD-10-CM | POA: Diagnosis not present

## 2019-09-22 DIAGNOSIS — R7989 Other specified abnormal findings of blood chemistry: Secondary | ICD-10-CM | POA: Diagnosis not present

## 2019-09-22 LAB — CBC
HCT: 46 % (ref 39.0–52.0)
Hemoglobin: 15.5 g/dL (ref 13.0–17.0)
MCH: 29.8 pg (ref 26.0–34.0)
MCHC: 33.7 g/dL (ref 30.0–36.0)
MCV: 88.5 fL (ref 80.0–100.0)
Platelets: 186 10*3/uL (ref 150–400)
RBC: 5.2 MIL/uL (ref 4.22–5.81)
RDW: 13.4 % (ref 11.5–15.5)
WBC: 11.2 10*3/uL — ABNORMAL HIGH (ref 4.0–10.5)
nRBC: 0 % (ref 0.0–0.2)

## 2019-09-22 LAB — SARS CORONAVIRUS 2 (TAT 6-24 HRS): SARS Coronavirus 2: NEGATIVE

## 2019-09-22 LAB — GLUCOSE, CAPILLARY
Glucose-Capillary: 246 mg/dL — ABNORMAL HIGH (ref 70–99)
Glucose-Capillary: 278 mg/dL — ABNORMAL HIGH (ref 70–99)

## 2019-09-22 MED ORDER — PANTOPRAZOLE SODIUM 40 MG PO TBEC
40.0000 mg | DELAYED_RELEASE_TABLET | Freq: Every day | ORAL | Status: DC
  Administered 2019-09-22: 40 mg via ORAL
  Filled 2019-09-22: qty 1

## 2019-09-22 MED ORDER — FAMOTIDINE 20 MG PO TABS
20.0000 mg | ORAL_TABLET | Freq: Two times a day (BID) | ORAL | Status: DC
  Administered 2019-09-22: 20 mg via ORAL
  Filled 2019-09-22: qty 1

## 2019-09-22 MED ORDER — ENOXAPARIN SODIUM 40 MG/0.4ML ~~LOC~~ SOLN: 40.0000 mg | SUBCUTANEOUS | Status: DC

## 2019-09-22 MED ORDER — DOXYCYCLINE HYCLATE 100 MG PO TABS
100.0000 mg | ORAL_TABLET | Freq: Two times a day (BID) | ORAL | Status: DC
  Administered 2019-09-22: 100 mg via ORAL
  Filled 2019-09-22: qty 1

## 2019-09-22 MED ORDER — DOXYCYCLINE HYCLATE 100 MG PO TABS: 100.0000 mg | ORAL_TABLET | Freq: Two times a day (BID) | ORAL | 0 refills | Status: AC

## 2019-09-22 MED ORDER — PROMETHAZINE HCL 25 MG/ML IJ SOLN
12.5000 mg | Freq: Four times a day (QID) | INTRAMUSCULAR | Status: DC | PRN
  Administered 2019-09-22: 12.5 mg via INTRAVENOUS
  Filled 2019-09-22: qty 1

## 2019-09-22 MED ORDER — PANTOPRAZOLE SODIUM 40 MG PO TBEC: 40.0000 mg | DELAYED_RELEASE_TABLET | Freq: Two times a day (BID) | ORAL | 0 refills | Status: AC

## 2019-09-22 MED ORDER — PROMETHAZINE HCL 25 MG RE SUPP
12.5000 mg | Freq: Four times a day (QID) | RECTAL | Status: DC | PRN
  Filled 2019-09-22: qty 1

## 2019-09-22 MED ORDER — PROMETHAZINE HCL 25 MG PO TABS
12.5000 mg | ORAL_TABLET | Freq: Four times a day (QID) | ORAL | Status: DC | PRN
  Filled 2019-09-22: qty 1

## 2019-09-22 MED ORDER — HEPARIN (PORCINE) 25000 UT/250ML-% IV SOLN
1100.0000 [IU]/h | INTRAVENOUS | Status: DC
  Administered 2019-09-22: 1100 [IU]/h via INTRAVENOUS
  Filled 2019-09-22: qty 250

## 2019-09-22 NOTE — Discharge Summary (Signed)
[facility] Discharge Summary   Patient: Tim Dean [id number]  PCP: System, Provider Not In  Date of admission: 09/21/2019   Date of discharge: 09/22/2019     Discharge Diagnoses:   Principal Problem:   Chest pain Active Problems:   Essential hypertension   COPD with chronic bronchitis (HCC)   OSA on CPAP   Type 2 diabetes mellitus with hyperlipidemia (HCC)   History of CVA (cerebrovascular accident)   GERD (gastroesophageal reflux disease)   Depression  Admitted From: Home Disposition:  Home   Recommendations for Outpatient Follow-up:  1. PCP: Follow-up with PCP in 1 week 2. Follow up LABS/TEST: None  Follow-up Information    PCP. Schedule an appointment as soon as possible for a visit in 1 week(s).          Diet recommendation: Cardiac diet  Activity: The patient is advised to gradually reintroduce usual activities, as tolerated  Discharge Condition: stable  Code Status: Full code   History of present illness: As per the H and P dictated on admission, "Tim Dean is a 74 y.o. male with medical history significant of CAD s/p DES 2012, CVA with residual left sided weakness, COPD, hypertension, OSA on Bipap, hx of colon cancer, insulin-dependent Type 2 diabetes, HLD, PTSD who presents with acute onset of mid-sternal chest pain.  Symptoms started about 6 hours ago. He had finished feeding birds outside and had just sat down. Felt sudden mid-sternal chest pain and took 3 SQ nitro without relieve. Pain is consult and dull and achy. He continued to have intermittently worsening chest pain here in the ER and it radiated to both arms and up his neck and back of his head.  Has some shortness of breath but no worse than usual for his COPD. Had worsening cough and sputum production this morning.  Has nausea but no vomiting, diarrhea or abdominal pain.   Patient was a former smoker and drinker but denies it currently. No illicit drug use.   He is feeling depressed and  endorse that he had thought about "shooting myself to end it." When asked if he owned a gun or has a plan patient just asked "why do I care?" "  Hospital Course:   Summary of his active problems in the hospital is as following.   Chest pain -Follows with [facility] outpatient -DES to [place] 2012 -Catherization in 2019 for angina and found to have patent stent to OM1, no change to in LCx or RCA disease. Progression of apical LAD disease and distal LAD - medical management pursued by [facility] in Oct 2019 CT chest showed diffuse CAD, bibasilar ground glass opacities reflecting atelectasis vs infiltrates/pneumonia.  Patient was started on heparin drip due to persistent chest pain. With negative troponins as well as unremarkable EKG I do not think the patient is suffering from acute CAD. Cardiology also recommended conservative measures with discontinuation of the heparin. We will discontinue heparin drip and discharge the patient home. - Continue Imdur  Reported a fall. Unwitnessed patient reported that he had a fall in the room. CT head unremarkable.  No focal deficit at the time of my evaluation.  No further work-up.  HTN  Continue HCTZ, losartan, metoprolol, prazosin  COPD Community-acquired pneumonia No acute exacerbation Continue bronchodilators Treat with oral doxycycline for possible pneumonia.  OSA bipap  Type 2 diabetes  Normally takes 50 units Lantus qHS Continue with resistant SSI  HLD  Continue statin  Hx of CVA Continue aspirin  GERD  Continue PPI   Depression Continue Cymbalta, trazodone  Patient was ambulatory without any assistance. On the day of the discharge the patient's vitals were stable, and no other acute medical condition were reported by patient. the patient was felt safe to be discharge at Home with no therapy needed on discharge.  Consultants: Cardiology  Procedures: none  Discharge Exam: General: Appear in no distress, no Rash;  Oral Mucosa Clear, moist. Cardiovascular: S1 and S2 Present, no Murmur, Respiratory: normal respiratory effort, Bilateral Air entry present and no Crackles, no wheezes Abdomen: Bowel Sound present, Soft and no tenderness, no hernia Extremities: no Pedal edema, no calf tenderness Neurology: alert and oriented to time, place, and person affect appropriate  [facility] Weights   09/21/19 1338 09/21/19 1849  Weight: 108.9 kg 110.2 kg   Vitals:   09/22/19 0345 09/22/19 0753  BP: (!) 145/65 129/80  Pulse: 87 82  Resp:  19  Temp: 98.5 F (36.9 C) 98.4 F (36.9 C)  SpO2: 93% 94%    DISCHARGE MEDICATION: Allergies as of 09/22/2019      Reactions   Citalopram    Other reaction(s): Muscle Pain   Simvastatin    Other reaction(s): Muscle Pain   Iodinated Diagnostic Agents Nausea Only   Other reaction(s): Dizziness   Penicillins Rash      Medication List    TAKE these medications   albuterol 108 (90 Base) MCG/ACT inhaler Commonly known as: VENTOLIN HFA Inhale into the lungs every 6 (six) hours as needed for wheezing or shortness of breath.   aspirin 81 MG tablet Take 81 mg by mouth daily.   budesonide-formoterol 160-4.5 MCG/ACT inhaler Commonly known as: SYMBICORT Inhale 2 puffs into the lungs 2 (two) times daily.   cetirizine 10 MG tablet Commonly known as: ZYRTEC Take 10 mg by mouth daily.   cholecalciferol 1000 units tablet Commonly known as: VITAMIN D Take 25 Units by mouth 2 (two) times daily.   cyanocobalamin 100 MCG tablet Take 100 mcg by mouth daily.   docusate sodium 50 MG capsule Commonly known as: COLACE Take 50 mg by mouth daily as needed for mild constipation.   doxycycline 100 MG tablet Commonly known as: VIBRA-TABS Take 1 tablet (100 mg total) by mouth 2 (two) times daily for 5 days.   DULoxetine 30 MG capsule Commonly known as: CYMBALTA Take 60 mg by mouth daily.   fluticasone 50 MCG/ACT nasal spray Commonly known as: FLONASE Place 2 sprays into  both nostrils daily.   hydrochlorothiazide 12.5 MG tablet Commonly known as: HYDRODIURIL Take 25 mg by mouth daily.   insulin glargine 100 UNIT/ML injection Commonly known as: LANTUS Inject 50 Units into the skin at bedtime.   insulin lispro 100 UNIT/ML injection Commonly known as: HUMALOG Inject into the skin 3 (three) times daily before meals.   ipratropium 0.06 % nasal spray Commonly known as: ATROVENT Place 2 sprays into both nostrils 4 (four) times daily as needed for rhinitis.   isosorbide dinitrate 30 MG tablet Commonly known as: ISORDIL Take 30 mg by mouth daily.   losartan 25 MG tablet Commonly known as: COZAAR Take 25 mg by mouth daily.   metFORMIN 500 MG tablet Commonly known as: GLUCOPHAGE Take by mouth 2 (two) times daily with a meal.   metoprolol tartrate 25 MG tablet Commonly known as: LOPRESSOR Take 50 mg by mouth 2 (two) times daily.   multivitamin tablet Take 1 tablet by mouth daily.   pantoprazole 40 MG tablet Commonly  known as: PROTONIX Take 1 tablet (40 mg total) by mouth 2 (two) times daily before a meal for 14 days. What changed: when to take this   potassium chloride SA 20 MEQ tablet Commonly known as: KLOR-CON Take 10 mEq by mouth daily.   prazosin 5 MG capsule Commonly known as: MINIPRESS Take 5 mg by mouth 2 (two) times daily.   rosuvastatin 10 MG tablet Commonly known as: CRESTOR Take 10 mg by mouth daily.   tiotropium 18 MCG inhalation capsule Commonly known as: SPIRIVA Place 18 mcg into inhaler and inhale daily.   trazodone 300 MG tablet Commonly known as: DESYREL Take 1 tablet (300 mg total) by mouth at bedtime.   vitamin C 100 MG tablet Take 100 mg by mouth daily.   vitamin E 45 MG (100 UNITS) capsule Take by mouth daily.      Allergies  Allergen Reactions  . Citalopram     Other reaction(s): Muscle Pain  . Simvastatin     Other reaction(s): Muscle Pain  . Iodinated Diagnostic Agents Nausea Only    Other  reaction(s): Dizziness  . Penicillins Rash   Discharge Instructions    Diet - low sodium heart healthy   Complete by: As directed    Increase activity slowly   Complete by: As directed       The results of significant diagnostics from this hospitalization (including imaging, microbiology, ancillary and laboratory) are listed below for reference.    Significant Diagnostic Studies: CT HEAD WO CONTRAST  Result Date: 09/22/2019 CLINICAL DATA:  Fall EXAM: CT HEAD WITHOUT CONTRAST TECHNIQUE: Contiguous axial images were obtained from the base of the skull through the vertex without intravenous contrast. COMPARISON:  None. FINDINGS: Brain: No acute intracranial hemorrhage. No focal mass lesion. No CT evidence of acute infarction. No midline shift or mass effect. No hydrocephalus. Basilar cisterns are patent. There are periventricular and subcortical white matter hypodensities. Generalized cortical atrophy. Vascular: No hyperdense vessel or unexpected calcification. Skull: Normal. Negative for fracture or focal lesion. Sinuses/Orbits: There is fluid in the LEFT maxillary sinus. Scattered opacification ethmoid air cells. No evidence of fracture. Other: None. IMPRESSION: 1. No intracranial trauma. 2. Maxillary and ethmoid sinusitis Electronically Signed   By: Suzy Bouchard M.D.   On: 09/22/2019 05:04   CT CHEST WO CONTRAST  Result Date: 09/21/2019 CLINICAL DATA:  Chest pain EXAM: CT CHEST WITHOUT CONTRAST TECHNIQUE: Multidetector CT imaging of the chest was performed following the standard protocol without IV contrast. COMPARISON:  Chest x-ray today next FINDINGS: Cardiovascular: Diffuse coronary artery calcifications. Scattered aortic atherosclerosis. Heart is normal size. Aorta is normal caliber. Mediastinum/Nodes: No mediastinal, hilar, or axillary adenopathy. Trachea and esophagus are unremarkable. Thyroid unremarkable. Lungs/Pleura: Bibasilar ground-glass opacities could reflect atelectasis or  early infiltrates. No effusions. Upper Abdomen: Fatty infiltration of the liver. Gastric distention with fluid and debris. Musculoskeletal: Chest wall soft tissues are unremarkable. No acute bony abnormality. IMPRESSION: Diffuse coronary artery calcifications/disease. Bibasilar ground-glass opacities could reflect atelectasis or developing infiltrates/pneumonia. Fatty infiltration of the liver. Distention of the stomach with fluid and debris. Aortic Atherosclerosis (ICD10-I70.0). Electronically Signed   By: Rolm Baptise M.D.   On: 09/21/2019 21:08   DG Chest Port 1 View  Result Date: 09/21/2019 CLINICAL DATA:  74 year old male former smoker presenting with midsternal chest pain. EXAM: PORTABLE CHEST 1 VIEW COMPARISON:  Chest x-ray 07/14/2018. FINDINGS: Lung volumes are low. Small left pleural effusion. Ill-defined opacity at the left base may reflect atelectasis and/or consolidation. Patchy  areas of interstitial prominence elsewhere in the lungs bilaterally. No right pleural effusion. No pneumothorax. No evidence of pulmonary edema. Heart size is normal. Upper mediastinal contours are within normal limits. Aortic atherosclerosis. IMPRESSION: 1. Low lung volumes with small left pleural effusion and areas of atelectasis and/or consolidation in the left lower lobe. 2. Aortic atherosclerosis. Electronically Signed   By: Vinnie Langton M.D.   On: 09/21/2019 14:14    Microbiology: Recent Results (from the past 240 hour(s))  SARS CORONAVIRUS 2 (TAT 6-24 HRS) Nasopharyngeal Nasopharyngeal Swab     Status: None   Collection Time: 09/21/19  3:28 PM   Specimen: Nasopharyngeal Swab  Result Value Ref Range Status   SARS Coronavirus 2 NEGATIVE NEGATIVE Final    Comment: (NOTE) SARS-CoV-2 target nucleic acids are NOT DETECTED. The SARS-CoV-2 RNA is generally detectable in upper and lower respiratory specimens during the acute phase of infection. Negative results do not preclude SARS-CoV-2 infection, do not rule  out co-infections with other pathogens, and should not be used as the sole basis for treatment or other patient management decisions. Negative results must be combined with clinical observations, patient history, and epidemiological information. The expected result is Negative. Fact Sheet for Patients: SugarRoll.be Fact Sheet for Healthcare Providers: https://www.woods-mathews.com/ This test is not yet approved or cleared by the [place] FDA and  has been authorized for detection and/or diagnosis of SARS-CoV-2 by FDA under an Emergency Use Authorization (EUA). This EUA will remain  in effect (meaning this test can be used) for the duration of the COVID-19 declaration under Section 56 4(b)(1) of the Act, 21 U.S.C. section 360bbb-3(b)(1), unless the authorization is terminated or revoked sooner. Performed at [facility], [place] [address] [place], [place] [address]      Labs: CBC: Recent Labs  Lab 09/21/19 1348 09/22/19 0757  WBC 9.7 11.2*  NEUTROABS 6.5  --   HGB 15.7 15.5  HCT 45.9 46.0  MCV 87.8 88.5  PLT 189 [id number]   Basic Metabolic Panel: Recent Labs  Lab 09/21/19 1348  NA 134*  K 4.0  CL 99  CO2 25  GLUCOSE 197*  BUN 12  CREATININE 1.16  CALCIUM 9.2   Liver Function Tests: Recent Labs  Lab 09/21/19 1348  AST 37  ALT 27  ALKPHOS 53  BILITOT 1.1  PROT 7.5  ALBUMIN 4.2   Recent Labs  Lab 09/21/19 1348  LIPASE 39   No results for input(s): AMMONIA in the last 168 hours. Cardiac Enzymes: No results for input(s): CKTOTAL, CKMB, CKMBINDEX, TROPONINI in the last 168 hours. BNP (last 3 results) No results for input(s): BNP in the last 8760 hours. CBG: Recent Labs  Lab 09/21/19 2009 09/22/19 0749 09/22/19 1140  GLUCAP 187* 246* 278*    Time spent: 35 minutes  Signed:  Berle Mull  [facility] 09/22/2019 9:11 PM

## 2019-09-22 NOTE — Care Management CC44 (Signed)
Condition Code 44 Documentation Completed  Patient Details  Name: Tim Dean MRN: [id number] Date of Birth: 1946/06/17   Condition Code 44 given:  Yes Patient signature on Condition Code 44 notice:  Yes Documentation of 2 MD's agreement:  Yes Code 44 added to claim:  Yes    Ross Ludwig, LCSW 09/22/2019, 1:55 PM

## 2019-09-22 NOTE — Progress Notes (Signed)
ANTICOAGULATION CONSULT NOTE - Initial Consult  Pharmacy Consult for heparin Indication: chest pain/ACS  Allergies  Allergen Reactions  . Citalopram     Other reaction(s): Muscle Pain  . Simvastatin     Other reaction(s): Muscle Pain  . Iodinated Diagnostic Agents Nausea Only    Other reaction(s): Dizziness  . Penicillins Rash    Patient Measurements: Height: 6' (182.9 cm) Weight: 243 lb (110.2 kg) IBW/kg (Calculated) : 77.6 Heparin Dosing Weight: 90 kg  Vital Signs: Temp: 98.4 F (36.9 C) (01/15 1958) Temp Source: Oral (01/15 1958) BP: 126/82 (01/15 1958) Pulse Rate: 76 (01/15 1958)  Labs: Recent Labs    09/21/19 1348 09/21/19 1907 09/21/19 2110  HGB 15.7  --   --   HCT 45.9  --   --   PLT 189  --   --   APTT 28  --   --   LABPROT 13.3  --   --   INR 1.0  --   --   CREATININE 1.16  --   --   TROPONINIHS 5 4 4     Estimated Creatinine Clearance: 70.5 mL/min (by C-G formula based on SCr of 1.16 mg/dL).   Medical History: Past Medical History:  Diagnosis Date  . Cancer ([place])   . COPD (chronic obstructive pulmonary disease) ([place])   . Diabetes mellitus without complication ([place])   . Hypercholesteremia   . Hypertension   . PTSD (post-traumatic stress disorder)     Medications:  Scheduled:  . aspirin  81 mg Oral Daily  . DULoxetine  60 mg Oral Daily  . hydrochlorothiazide  25 mg Oral Daily  . insulin aspart  0-20 Units Subcutaneous TID WC  . isosorbide dinitrate  30 mg Oral Daily  . losartan  25 mg Oral Daily  . metoprolol tartrate  50 mg Oral BID  . mometasone-formoterol  2 puff Inhalation BID  .  morphine injection  4 mg Intravenous Once  . nitroGLYCERIN  1 inch Topical Q6H  . pantoprazole  40 mg Oral BID  . potassium chloride SA  10 mEq Oral Daily  . prazosin  5 mg Oral BID  . rosuvastatin  10 mg Oral Daily  . trazodone  300 mg Oral QHS  . cyanocobalamin  100 mcg Oral Daily    Assessment: Patient arrives w/ CP w/ h/o CAD s/p DES 2012, CVA,  COPD w/ acute onset of midsternal CP, trops currently WNL, EKG on 01/15 @ 2000 shows NSR w/ possibly slight ST abnormalities in leads III, aVF, and V3. No anticoagulation PTA. Baseline labs WNL, patient had received a dose of lovenox 40 mg earlier at 2044. Patient is being started on heparin drip for management of UA/ACS.  Goal of Therapy:  Heparin level 0.3-0.7 units/ml Monitor platelets by anticoagulation protocol: Yes   Plan:  Will omit bolus given patient received a dose for VTE lovenox. Will start rate at 1100 units/hr. Will check anti-Xa level at 0800. Will continue to monitor daily CBC's and adjust per anti-Xa levels.  Tobie Lords, PharmD, BCPS Clinical Pharmacist 09/22/2019,12:02 AM

## 2019-09-22 NOTE — Care Management Obs Status (Signed)
[facility] NOTIFICATION   Patient Details  Name: LAWSEN PATA MRN: [id number] Date of Birth: December 07, 1945   Medicare Observation Status Notification Given:  Yes    Ross Ludwig, LCSW 09/22/2019, 1:55 PM

## 2019-09-22 NOTE — Progress Notes (Signed)
Patient discharged to home. Wife and patient verbalize understanding of discharge instructions. Tele and IV removed prior to discharge.

## 2019-09-22 NOTE — Consult Note (Signed)
[facility] Consultation Note  Patient ID: Tim Dean, MRN: [id number], DOB/AGE: Dec 22, 1945 74 y.o. Admit date: 09/21/2019   Date of Consult: 09/22/2019 Primary Physician: System, Provider Not In Primary Cardiologist: [facility]  Chief Complaint:  Chief Complaint  Patient presents with  . Chest Pain   Reason for Consult: Chest pain  HPI: 74 y.o. male with known chronic obstructive pulmonary disease diabetes hypertension hyperlipidemia coronary atherosclerosis status post PCI and stent placement of left circumflex artery for which she had the last cardiac catheterization in 2019 of which the patient had a diffuse distal vessel disease not amenable to PCI and stent placement and stable.  He had been placed on appropriate medication management is done very well with this medication management.  Recently the patient has had an acute onset of chest discomfort substernal in nature which did not relieved by typical medication management for cardiovascular treatment.  The patient had an EKG showing normal sinus rhythm and nonspecific ST changes and a troponin peak of 5.  There is been no evidence of myocardial infarction or true angina since admission with full resolution of chest pain at this point.  The patient did have a CT scan showing possible atelectasis but no other concerning changes.  Currently the patient is hemodynamically stable and feeling well with no current evidence of primary cause of chest discomfort.  Past Medical History:  Diagnosis Date  . Cancer ([place])   . COPD (chronic obstructive pulmonary disease) ([place])   . Diabetes mellitus without complication ([place])   . Hypercholesteremia   . Hypertension   . PTSD (post-traumatic stress disorder)       Surgical History:  Past Surgical History:  Procedure Laterality Date  . ABDOMINAL SURGERY    . CHOLECYSTECTOMY       Home Meds: Prior to Admission medications   Medication Sig Start Date End Date Taking? Authorizing  Provider  albuterol (PROVENTIL HFA;VENTOLIN HFA) 108 (90 BASE) MCG/ACT inhaler Inhale into the lungs every 6 (six) hours as needed for wheezing or shortness of breath.   Yes [provider]  Ascorbic Acid (VITAMIN C) 100 MG tablet Take 100 mg by mouth daily.   Yes [provider]  aspirin 81 MG tablet Take 81 mg by mouth daily.   Yes [provider]  budesonide-formoterol (SYMBICORT) 160-4.5 MCG/ACT inhaler Inhale 2 puffs into the lungs 2 (two) times daily.   Yes [provider]  cetirizine (ZYRTEC) 10 MG tablet Take 10 mg by mouth daily.   Yes [provider]  cholecalciferol (VITAMIN D) 1000 units tablet Take 25 Units by mouth 2 (two) times daily.   Yes [provider]  cyanocobalamin 100 MCG tablet Take 100 mcg by mouth daily.   Yes [provider]  docusate sodium (COLACE) 50 MG capsule Take 50 mg by mouth daily as needed for mild constipation.   Yes [provider]  DULoxetine (CYMBALTA) 30 MG capsule Take 60 mg by mouth daily.    Yes [provider]  hydrochlorothiazide (HYDRODIURIL) 12.5 MG tablet Take 25 mg by mouth daily.    Yes [provider]  insulin glargine (LANTUS) 100 UNIT/ML injection Inject 50 Units into the skin at bedtime.    Yes [provider]  isosorbide dinitrate (ISORDIL) 30 MG tablet Take 30 mg by mouth daily.   Yes [provider]  losartan (COZAAR) 25 MG tablet Take 25 mg by mouth daily.   Yes [provider]  metFORMIN (GLUCOPHAGE) 500 MG  tablet Take by mouth 2 (two) times daily with a meal.   Yes [provider]  metoprolol tartrate (LOPRESSOR) 25 MG tablet Take 50 mg by mouth 2 (two) times daily.    Yes [provider]  Multiple Vitamin (MULTIVITAMIN) tablet Take 1 tablet by mouth daily.   Yes [provider]  potassium chloride SA (KLOR-CON) 20 MEQ tablet Take 10 mEq by mouth daily.   Yes [provider]  prazosin  (MINIPRESS) 5 MG capsule Take 5 mg by mouth 2 (two) times daily.    Yes [provider]  rosuvastatin (CRESTOR) 10 MG tablet Take 10 mg by mouth daily.    Yes [provider]  tiotropium (SPIRIVA) 18 MCG inhalation capsule Place 18 mcg into inhaler and inhale daily.   Yes [provider]  trazodone (DESYREL) 300 MG tablet Take 1 tablet (300 mg total) by mouth at bedtime. 09/09/19  Yes Cook, Jayce G, DO  vitamin E 100 UNIT capsule Take by mouth daily.   Yes [provider]  fluticasone (FLONASE) 50 MCG/ACT nasal spray Place 2 sprays into both nostrils daily. Patient not taking: Reported on 09/21/2019 07/14/18   Melynda Ripple, MD  insulin lispro (HUMALOG) 100 UNIT/ML injection Inject into the skin 3 (three) times daily before meals.    [provider]  ipratropium (ATROVENT) 0.06 % nasal spray Place 2 sprays into both nostrils 4 (four) times daily as needed for rhinitis. Patient not taking: Reported on 09/21/2019 11/30/18   Coral Spikes, DO  pantoprazole (PROTONIX) 40 MG tablet Take 40 mg by mouth 2 (two) times daily.    [provider]    Inpatient Medications:  . aspirin EC  81 mg Oral Daily  . DULoxetine  60 mg Oral Daily  . [START ON 09/23/2019] enoxaparin (LOVENOX) injection  40 mg Subcutaneous Q24H  . famotidine  20 mg Oral BID  . insulin aspart  0-20 Units Subcutaneous TID WC  . isosorbide dinitrate  30 mg Oral Daily  . losartan  25 mg Oral Daily  . metoprolol tartrate  50 mg Oral BID  . mometasone-formoterol  2 puff Inhalation BID  . nitroGLYCERIN  1 inch Topical Q6H  . prazosin  5 mg Oral BID  . rosuvastatin  10 mg Oral Daily  . trazodone  300 mg Oral QHS  . cyanocobalamin  100 mcg Oral Daily     Allergies:  Allergies  Allergen Reactions  . Citalopram     Other reaction(s): Muscle Pain  . Simvastatin     Other reaction(s): Muscle Pain  . Iodinated Diagnostic Agents Nausea Only    Other reaction(s): Dizziness  .  Penicillins Rash    Social History   Socioeconomic History  . Marital status: Married    Spouse name: Not on file  . Number of children: Not on file  . Years of education: Not on file  . Highest education level: Not on file  Occupational History  . Not on file  Tobacco Use  . Smoking status: Former Smoker    Quit date: 11/29/2000    Years since quitting: 18.8  . Smokeless tobacco: Never Used  Substance and Sexual Activity  . Alcohol use: No  . Drug use: Never  . Sexual activity: Not on file  Other Topics Concern  . Not on file  Social History Narrative  . Not on file   Social Determinants of Health   Financial Resource Strain:   . Difficulty of Paying Living  Expenses: Not on file  Food Insecurity:   . Worried About Charity fundraiser in the Last Year: Not on file  . Ran Out of Food in the Last Year: Not on file  Transportation Needs:   . Lack of Transportation (Medical): Not on file  . Lack of Transportation (Non-Medical): Not on file  Physical Activity:   . Days of Exercise per Week: Not on file  . Minutes of Exercise per Session: Not on file  Stress:   . Feeling of Stress : Not on file  Social Connections:   . Frequency of Communication with Friends and Family: Not on file  . Frequency of Social Gatherings with Friends and Family: Not on file  . Attends Religious Services: Not on file  . Active Member of Clubs or Organizations: Not on file  . Attends Archivist Meetings: Not on file  . Marital Status: Not on file  Intimate Partner Violence:   . Fear of Current or Ex-Partner: Not on file  . Emotionally Abused: Not on file  . Physically Abused: Not on file  . Sexually Abused: Not on file     Family History  Problem Relation Age of Onset  . Cancer Mother   . Lead poisoning Father      Review of Systems Positive for weakness Negative for: General:  chills, fever, night sweats or weight changes.  Cardiovascular: PND orthopnea syncope dizziness   Dermatological skin lesions rashes Respiratory: Cough congestion Urologic: Frequent urination urination at night and hematuria Abdominal: negative for nausea, vomiting, diarrhea, bright red blood per rectum, melena, or hematemesis Neurologic: negative for visual changes, and/or hearing changes  All other systems reviewed and are otherwise negative except as noted above.  Labs: No results for input(s): CKTOTAL, CKMB, TROPONINI in the last 72 hours. Lab Results  Component Value Date   WBC 11.2 (H) 09/22/2019   HGB 15.5 09/22/2019   HCT 46.0 09/22/2019   MCV 88.5 09/22/2019   PLT 186 09/22/2019    Recent Labs  Lab 09/21/19 1348  NA 134*  K 4.0  CL 99  CO2 25  BUN 12  CREATININE 1.16  CALCIUM 9.2  PROT 7.5  BILITOT 1.1  ALKPHOS 53  ALT 27  AST 37  GLUCOSE 197*   No results found for: CHOL, HDL, LDLCALC, TRIG No results found for: DDIMER  Radiology/Studies:  CT HEAD WO CONTRAST  Result Date: 09/22/2019 CLINICAL DATA:  Fall EXAM: CT HEAD WITHOUT CONTRAST TECHNIQUE: Contiguous axial images were obtained from the base of the skull through the vertex without intravenous contrast. COMPARISON:  None. FINDINGS: Brain: No acute intracranial hemorrhage. No focal mass lesion. No CT evidence of acute infarction. No midline shift or mass effect. No hydrocephalus. Basilar cisterns are patent. There are periventricular and subcortical white matter hypodensities. Generalized cortical atrophy. Vascular: No hyperdense vessel or unexpected calcification. Skull: Normal. Negative for fracture or focal lesion. Sinuses/Orbits: There is fluid in the LEFT maxillary sinus. Scattered opacification ethmoid air cells. No evidence of fracture. Other: None. IMPRESSION: 1. No intracranial trauma. 2. Maxillary and ethmoid sinusitis Electronically Signed   By: Suzy Bouchard M.D.   On: 09/22/2019 05:04   CT CHEST WO CONTRAST  Result Date: 09/21/2019 CLINICAL DATA:  Chest pain EXAM: CT CHEST WITHOUT  CONTRAST TECHNIQUE: Multidetector CT imaging of the chest was performed following the standard protocol without IV contrast. COMPARISON:  Chest x-ray today next FINDINGS: Cardiovascular: Diffuse coronary artery calcifications. Scattered aortic atherosclerosis. Heart is normal  size. Aorta is normal caliber. Mediastinum/Nodes: No mediastinal, hilar, or axillary adenopathy. Trachea and esophagus are unremarkable. Thyroid unremarkable. Lungs/Pleura: Bibasilar ground-glass opacities could reflect atelectasis or early infiltrates. No effusions. Upper Abdomen: Fatty infiltration of the liver. Gastric distention with fluid and debris. Musculoskeletal: Chest wall soft tissues are unremarkable. No acute bony abnormality. IMPRESSION: Diffuse coronary artery calcifications/disease. Bibasilar ground-glass opacities could reflect atelectasis or developing infiltrates/pneumonia. Fatty infiltration of the liver. Distention of the stomach with fluid and debris. Aortic Atherosclerosis (ICD10-I70.0). Electronically Signed   By: Rolm Baptise M.D.   On: 09/21/2019 21:08   DG Chest Port 1 View  Result Date: 09/21/2019 CLINICAL DATA:  74 year old male former smoker presenting with midsternal chest pain. EXAM: PORTABLE CHEST 1 VIEW COMPARISON:  Chest x-ray 07/14/2018. FINDINGS: Lung volumes are low. Small left pleural effusion. Ill-defined opacity at the left base may reflect atelectasis and/or consolidation. Patchy areas of interstitial prominence elsewhere in the lungs bilaterally. No right pleural effusion. No pneumothorax. No evidence of pulmonary edema. Heart size is normal. Upper mediastinal contours are within normal limits. Aortic atherosclerosis. IMPRESSION: 1. Low lung volumes with small left pleural effusion and areas of atelectasis and/or consolidation in the left lower lobe. 2. Aortic atherosclerosis. Electronically Signed   By: Vinnie Langton M.D.   On: 09/21/2019 14:14    EKG: Normal sinus rhythm  Weights: Filed  Weights   09/21/19 1338 09/21/19 1849  Weight: 108.9 kg 110.2 kg     Physical Exam: Blood pressure 129/80, pulse 82, temperature 98.4 F (36.9 C), temperature source Oral, resp. rate 19, height 6' (1.829 m), weight 110.2 kg, SpO2 94 %. Body mass index is 32.96 kg/m. General: Well developed, well nourished, in no acute distress. Head eyes ears nose throat: Normocephalic, atraumatic, sclera non-icteric, no xanthomas, nares are without discharge. No apparent thyromegaly and/or mass  Lungs: Normal respiratory effort.  no wheezes, no rales, no rhonchi.  Heart: RRR with normal S1 S2. no murmur gallop, no rub, PMI is normal size and placement, carotid upstroke normal without bruit, jugular venous pressure is normal Abdomen: Soft, non-tender, distended with normoactive bowel sounds. No hepatomegaly. No rebound/guarding. No obvious abdominal masses. Abdominal aorta is normal size without bruit Extremities: Trace to plus edema. no cyanosis, no clubbing, no ulcers  Peripheral : 2+ bilateral upper extremity pulses, 2+ bilateral femoral pulses, 2+ bilateral dorsal pedal pulse Neuro: Alert and oriented. No facial asymmetry. No focal deficit. Moves all extremities spontaneously. Musculoskeletal: Normal muscle tone without kyphosis Psych:  Responds to questions appropriately with a normal affect.    Assessment: 74 year old male with known diabetes hypertension hyperlipidemia COPD peripheral vascular disease with atypical chest discomfort and no current evidence of congestive heart failure or myocardial infarction  Plan: 1.  Continue supportive care weakness and resolved chest discomfort 2.  Continue medication management as before for risk factor reduction including hypertension hyperlipidemia treatment 3.  Continue isosorbide for distal vessel coronary artery disease 4.  No further cardiac intervention or diagnostics necessary at this time 5.  Begin ambulation and follow-up for improvements of  symptoms and okay for discharge to home if ambulating well with follow-up this week for further adjustments of medication management if necessary  Signed, Corey Skains M.D. [facility] 09/22/2019, 8:25 AM

## 2019-09-23 NOTE — Progress Notes (Signed)
Pt found standing at bedside, IV pulled out and blood all over floor, counter, bed, and pt gown . IV had heparin gtt infusing prior to pt removal. Pt cleaned up, assisted back to bed, VS obtained, and examined for any signs of injury. Pt stated that he hit the right side of his forehead on the cabinet in his room; no one present in room at time of stated fall. Pt had no bruises, lacerations or swelling at site that pt state he struck, nor were there any other visible signs of injury to pt's body. On call provider notified, head CT obtained to r/o any possible head injury. Pt was disoriented at the time he was found standing by his bed, but is oriented x 4 at the time this note written.

## 2019-09-26 DIAGNOSIS — Z23 Encounter for immunization: Secondary | ICD-10-CM | POA: Diagnosis not present

## 2019-10-17 DIAGNOSIS — H9203 Otalgia, bilateral: Secondary | ICD-10-CM | POA: Diagnosis not present

## 2019-11-09 DIAGNOSIS — H9203 Otalgia, bilateral: Secondary | ICD-10-CM | POA: Diagnosis not present

## 2019-11-09 DIAGNOSIS — H60541 Acute eczematoid otitis externa, right ear: Secondary | ICD-10-CM | POA: Diagnosis not present

## 2020-01-06 ENCOUNTER — Other Ambulatory Visit: Payer: Self-pay

## 2020-01-06 ENCOUNTER — Ambulatory Visit
Admission: EM | Admit: 2020-01-06 | Discharge: 2020-01-06 | Disposition: A | Discharge status: Home / Self Care | Payer: Medicare Other | Attending: Emergency Medicine | Admitting: Emergency Medicine

## 2020-01-06 ENCOUNTER — Encounter: Payer: Self-pay | Admitting: Emergency Medicine

## 2020-01-06 DIAGNOSIS — S46911A Strain of unspecified muscle, fascia and tendon at shoulder and upper arm level, right arm, initial encounter: Secondary | ICD-10-CM

## 2020-01-06 DIAGNOSIS — M62838 Other muscle spasm: Secondary | ICD-10-CM | POA: Diagnosis not present

## 2020-01-06 DIAGNOSIS — X503XXA Overexertion from repetitive movements, initial encounter: Secondary | ICD-10-CM | POA: Diagnosis not present

## 2020-01-06 MED ORDER — TIZANIDINE HCL 4 MG PO TABS: 4.0000 mg | ORAL_TABLET | Freq: Three times a day (TID) | ORAL | 0 refills | Status: AC | PRN

## 2020-01-06 NOTE — Discharge Instructions (Addendum)
Zanaflex will help relax the muscles, ibuprofen 400 mg combined with 1000 mg of Tylenol 3-4 times a day as needed for pain, heat or ice, whichever feels better, follow-up with orthopedics in 7 days if not any better.

## 2020-01-06 NOTE — ED Triage Notes (Signed)
Patient in today c/o right shoulder pain x 2 days. Patient states that he has been using a hand saw and pain started after that. Patient has tried OTC Tylenol, Ibuprofen and Aspercreme without relief.

## 2020-01-06 NOTE — ED Provider Notes (Signed)
HPI  SUBJECTIVE:  Tim Dean is a right-handed 74 y.o. male who presents with right shoulder pain that radiates down his arm starting while using a hand saw to cut branches down.  States that the pain persisted.  He describes it as sharp, stabbing, intermittent lasting a second or 2.  He reports a dull constant achiness at the top of his shoulder.  He denies chest pain, change in his baseline shortness of breath, radiation of this pain up his neck.  There is no exertional component.  No nausea, diaphoresis, distal numbness or tingling.  He reports grip weakness.  He states that this does not feel like his unstable angina for which she was admitted to the hospital in January.  He has had never had symptoms like this before he has tried alternating Tylenol and ibuprofen 2-3 times a day, Aspercreme with some improvement in his symptoms.  Symptoms are worse with turning over in bed, using his shoulder.  Past medical history of colon cancer, COPD, diabetes, hypertension, coronary artery disease status post stent, hypercholesterolemia, CVA with residual left-sided weakness.  No history of right shoulder injury, chronic kidney disease.  PMD: Dr[facility] Delice Lesch at the [facility].  Past Medical History:  Diagnosis Date  . Cancer ([place])   . COPD (chronic obstructive pulmonary disease) ([place])   . Diabetes mellitus without complication ([place])   . Hypercholesteremia   . Hypertension   . PTSD (post-traumatic stress disorder)     Past Surgical History:  Procedure Laterality Date  . ABDOMINAL SURGERY    . CHOLECYSTECTOMY      Family History  Problem Relation Age of Onset  . Cancer Mother   . Lead poisoning Father     Social History   Tobacco Use  . Smoking status: Former Smoker    Quit date: 11/29/2000    Years since quitting: 19.1  . Smokeless tobacco: Never Used  Substance Use Topics  . Alcohol use: No  . Drug use: Never    No current facility-administered medications for this encounter.  Current Outpatient  Medications:  .  albuterol (PROVENTIL HFA;VENTOLIN HFA) 108 (90 BASE) MCG/ACT inhaler, Inhale into the lungs every 6 (six) hours as needed for wheezing or shortness of breath., Disp: , Rfl:  .  Ascorbic Acid (VITAMIN C) 100 MG tablet, Take 100 mg by mouth daily., Disp: , Rfl:  .  aspirin 81 MG tablet, Take 81 mg by mouth daily., Disp: , Rfl:  .  budesonide-formoterol (SYMBICORT) 160-4.5 MCG/ACT inhaler, Inhale 2 puffs into the lungs 2 (two) times daily., Disp: , Rfl:  .  cetirizine (ZYRTEC) 10 MG tablet, Take 10 mg by mouth daily., Disp: , Rfl:  .  cholecalciferol (VITAMIN D) 1000 units tablet, Take 25 Units by mouth 2 (two) times daily., Disp: , Rfl:  .  cyanocobalamin 100 MCG tablet, Take 100 mcg by mouth daily., Disp: , Rfl:  .  docusate sodium (COLACE) 50 MG capsule, Take 50 mg by mouth daily as needed for mild constipation., Disp: , Rfl:  .  DULoxetine (CYMBALTA) 30 MG capsule, Take 60 mg by mouth daily. , Disp: , Rfl:  .  fluticasone (FLONASE) 50 MCG/ACT nasal spray, Place 2 sprays into both nostrils daily., Disp: 16 g, Rfl: 0 .  hydrochlorothiazide (HYDRODIURIL) 12.5 MG tablet, Take 25 mg by mouth daily. , Disp: , Rfl:  .  insulin glargine (LANTUS) 100 UNIT/ML injection, Inject 50 Units into the skin at bedtime. , Disp: , Rfl:  .  insulin  lispro (HUMALOG) 100 UNIT/ML injection, Inject into the skin 3 (three) times daily before meals., Disp: , Rfl:  .  isosorbide dinitrate (ISORDIL) 30 MG tablet, Take 30 mg by mouth daily., Disp: , Rfl:  .  losartan (COZAAR) 25 MG tablet, Take 25 mg by mouth daily., Disp: , Rfl:  .  metFORMIN (GLUCOPHAGE) 500 MG tablet, Take by mouth 2 (two) times daily with a meal., Disp: , Rfl:  .  metoprolol tartrate (LOPRESSOR) 25 MG tablet, Take 50 mg by mouth 2 (two) times daily. , Disp: , Rfl:  .  Multiple Vitamin (MULTIVITAMIN) tablet, Take 1 tablet by mouth daily., Disp: , Rfl:  .  pantoprazole (PROTONIX) 40 MG tablet, Take 1 tablet (40 mg total) by mouth 2 (two)  times daily before a meal for 14 days., Disp: 28 tablet, Rfl: 0 .  potassium chloride SA (KLOR-CON) 20 MEQ tablet, Take 10 mEq by mouth daily., Disp: , Rfl:  .  prazosin (MINIPRESS) 5 MG capsule, Take 5 mg by mouth 2 (two) times daily. , Disp: , Rfl:  .  rosuvastatin (CRESTOR) 10 MG tablet, Take 10 mg by mouth daily. , Disp: , Rfl:  .  tiotropium (SPIRIVA) 18 MCG inhalation capsule, Place 18 mcg into inhaler and inhale daily., Disp: , Rfl:  .  trazodone (DESYREL) 300 MG tablet, Take 1 tablet (300 mg total) by mouth at bedtime., Disp: 90 tablet, Rfl: 1 .  vitamin E 100 UNIT capsule, Take by mouth daily., Disp: , Rfl:  .  tiZANidine (ZANAFLEX) 4 MG tablet, Take 1 tablet (4 mg total) by mouth every 8 (eight) hours as needed for muscle spasms., Disp: 30 tablet, Rfl: 0  Allergies  Allergen Reactions  . Citalopram     Other reaction(s): Muscle Pain  . Simvastatin     Other reaction(s): Muscle Pain  . Iodinated Diagnostic Agents Nausea Only    Other reaction(s): Dizziness  . Lisinopril     Other reaction(s): Cough  . Sertraline Other (See Comments)  . Spironolactone Diarrhea  . Penicillins Rash     ROS  As noted in HPI.   Physical Exam  BP 125/79 (BP Location: Left Arm)   Pulse 79   Temp 98.3 F (36.8 C) (Oral)   Resp 18   Ht 6' (1.829 m)   Wt 104.3 kg   SpO2 96%   BMI 31.19 kg/m   Constitutional: Well developed, well nourished, no acute distress Eyes:  EOMI, conjunctiva normal bilaterally HENT: Normocephalic, atraumatic,mucus membranes moist Neck: No C-spine tenderness. Respiratory: Normal inspiratory effort Cardiovascular: Normal rate GI: nondistended skin: No rash, skin intact Musculoskeletal: Positive right-sided trapezial tenderness, muscle spasm.  Positive tenderness over the rhomboid.  No tenderness over the latissimus dorsi, scapula.  Positive tenderness at the [facility] joint.  No tenderness along the clavicle.  Positive tenderness at the deltoid region. Rshoulder with  ROM  somewhat limited, Drop test  painful but negative, proximal humerus  tender, shoulder joint  tender, Motor strength normal, Sensation intact LT over deltoid region, distal NVI with hand on affected side having intact sensation and strength in the distribution of the median, radial, and ulnar nerve.  Pain  with internal rotation, pain  with external rotation,  negative tenderness in bicipital groove, positive empty can test, negative liftoff test, pain aggravated with, but no instability with abduction/external rotation. Neurologic: Alert & oriented x 3, no focal neuro deficits Psychiatric: Speech and behavior appropriate   ED Course   Medications - No data to  display  No orders of the defined types were placed in this encounter.   No results found for this or any previous visit (from the past 24 hour(s)). No results found.  ED Clinical Impression  1. Repetitive strain injury of right shoulder, initial encounter   2. Trapezius muscle spasm      ED Assessment/Plan  Previous records reviewed. As noted in HPI.  Doubt cardiac cause.  His pain is fully reproducible and has an event that would cause this pain.  He has muscle spasm on exam.  Do not think we need to get a x-ray today.  Doubt fracture, dislocation.  Suspect that he has aggravated his rotator cuff, also has trapezius spasm.  Will send home with Zanaflex, ibuprofen 400 mg combined with 1000 mg of Tylenol 3-4 times a day as needed for pain, heat or ice, whichever feels better, follow-up with orthopedics in 7 days if not any better.  Patient given ER return precautions.  Discussed  MDM, treatment plan, and plan for follow-up with patient. Discussed sn/sx that should prompt return to the ED. patient agrees with plan.   Meds ordered this encounter  Medications  . tiZANidine (ZANAFLEX) 4 MG tablet    Sig: Take 1 tablet (4 mg total) by mouth every 8 (eight) hours as needed for muscle spasms.    Dispense:  30 tablet    Refill:   0    *This clinic note was created using [employer]. Therefore, there may be occasional mistakes despite careful proofreading.   ?    Melynda Ripple, MD 01/06/20 1247

## 2020-01-11 DIAGNOSIS — M5030 Other cervical disc degeneration, unspecified cervical region: Secondary | ICD-10-CM | POA: Diagnosis not present

## 2020-01-11 DIAGNOSIS — S46011A Strain of muscle(s) and tendon(s) of the rotator cuff of right shoulder, initial encounter: Secondary | ICD-10-CM | POA: Diagnosis not present

## 2020-01-16 DIAGNOSIS — M75111 Incomplete rotator cuff tear or rupture of right shoulder, not specified as traumatic: Secondary | ICD-10-CM | POA: Diagnosis not present

## 2020-01-16 DIAGNOSIS — S46011A Strain of muscle(s) and tendon(s) of the rotator cuff of right shoulder, initial encounter: Secondary | ICD-10-CM | POA: Diagnosis not present

## 2020-01-24 DIAGNOSIS — S46011A Strain of muscle(s) and tendon(s) of the rotator cuff of right shoulder, initial encounter: Secondary | ICD-10-CM | POA: Diagnosis not present
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
## 2020-02-05 DIAGNOSIS — Z01812 Encounter for preprocedural laboratory examination: Secondary | ICD-10-CM | POA: Diagnosis not present

## 2020-02-05 DIAGNOSIS — Z20822 Contact with and (suspected) exposure to covid-19: Secondary | ICD-10-CM | POA: Diagnosis not present

## 2020-02-06 DIAGNOSIS — Z87891 Personal history of nicotine dependence: Secondary | ICD-10-CM | POA: Diagnosis not present

## 2020-02-06 DIAGNOSIS — F329 Major depressive disorder, single episode, unspecified: Secondary | ICD-10-CM | POA: Diagnosis not present

## 2020-02-06 DIAGNOSIS — M19011 Primary osteoarthritis, right shoulder: Secondary | ICD-10-CM | POA: Diagnosis not present

## 2020-02-06 DIAGNOSIS — F40240 Claustrophobia: Secondary | ICD-10-CM | POA: Diagnosis not present

## 2020-02-06 DIAGNOSIS — G4733 Obstructive sleep apnea (adult) (pediatric): Secondary | ICD-10-CM | POA: Diagnosis not present

## 2020-02-06 DIAGNOSIS — Z8673 Personal history of transient ischemic attack (TIA), and cerebral infarction without residual deficits: Secondary | ICD-10-CM | POA: Diagnosis not present

## 2020-02-06 DIAGNOSIS — Z96659 Presence of unspecified artificial knee joint: Secondary | ICD-10-CM | POA: Diagnosis not present

## 2020-02-06 DIAGNOSIS — Z7982 Long term (current) use of aspirin: Secondary | ICD-10-CM | POA: Diagnosis not present

## 2020-02-06 DIAGNOSIS — Z888 Allergy status to other drugs, medicaments and biological substances status: Secondary | ICD-10-CM | POA: Diagnosis not present

## 2020-02-06 DIAGNOSIS — Z7952 Long term (current) use of systemic steroids: Secondary | ICD-10-CM | POA: Diagnosis not present

## 2020-02-06 DIAGNOSIS — Z85038 Personal history of other malignant neoplasm of large intestine: Secondary | ICD-10-CM | POA: Diagnosis not present

## 2020-02-06 DIAGNOSIS — E119 Type 2 diabetes mellitus without complications: Secondary | ICD-10-CM | POA: Diagnosis not present

## 2020-02-06 DIAGNOSIS — Z794 Long term (current) use of insulin: Secondary | ICD-10-CM | POA: Diagnosis not present

## 2020-02-06 DIAGNOSIS — Z955 Presence of coronary angioplasty implant and graft: Secondary | ICD-10-CM | POA: Diagnosis not present

## 2020-02-06 DIAGNOSIS — I10 Essential (primary) hypertension: Secondary | ICD-10-CM | POA: Diagnosis not present

## 2020-02-06 DIAGNOSIS — I252 Old myocardial infarction: Secondary | ICD-10-CM | POA: Diagnosis not present

## 2020-02-06 DIAGNOSIS — G8918 Other acute postprocedural pain: Secondary | ICD-10-CM | POA: Diagnosis not present

## 2020-02-06 DIAGNOSIS — Z7901 Long term (current) use of anticoagulants: Secondary | ICD-10-CM | POA: Diagnosis not present

## 2020-02-06 DIAGNOSIS — J449 Chronic obstructive pulmonary disease, unspecified: Secondary | ICD-10-CM | POA: Diagnosis not present

## 2020-02-06 DIAGNOSIS — Z91041 Radiographic dye allergy status: Secondary | ICD-10-CM | POA: Diagnosis not present

## 2020-02-06 DIAGNOSIS — Z880 Allergy status to penicillin: Secondary | ICD-10-CM | POA: Diagnosis not present

## 2020-02-06 DIAGNOSIS — M7541 Impingement syndrome of right shoulder: Secondary | ICD-10-CM | POA: Diagnosis not present

## 2020-02-06 DIAGNOSIS — S46011A Strain of muscle(s) and tendon(s) of the rotator cuff of right shoulder, initial encounter: Secondary | ICD-10-CM | POA: Diagnosis not present

## 2020-02-06 DIAGNOSIS — M25511 Pain in right shoulder: Secondary | ICD-10-CM | POA: Diagnosis not present

## 2020-03-27 DIAGNOSIS — Z4889 Encounter for other specified surgical aftercare: Secondary | ICD-10-CM | POA: Diagnosis not present

## 2020-03-27 DIAGNOSIS — M25511 Pain in right shoulder: Secondary | ICD-10-CM | POA: Diagnosis not present

## 2020-03-27 DIAGNOSIS — R600 Localized edema: Secondary | ICD-10-CM | POA: Diagnosis not present

## 2020-04-02 DIAGNOSIS — M25511 Pain in right shoulder: Secondary | ICD-10-CM | POA: Diagnosis not present

## 2020-04-02 DIAGNOSIS — Z4889 Encounter for other specified surgical aftercare: Secondary | ICD-10-CM | POA: Diagnosis not present

## 2020-04-04 DIAGNOSIS — Z4889 Encounter for other specified surgical aftercare: Secondary | ICD-10-CM | POA: Diagnosis not present

## 2020-04-04 DIAGNOSIS — M25511 Pain in right shoulder: Secondary | ICD-10-CM | POA: Diagnosis not present

## 2020-04-09 DIAGNOSIS — Z4889 Encounter for other specified surgical aftercare: Secondary | ICD-10-CM | POA: Diagnosis not present

## 2020-04-09 DIAGNOSIS — M25511 Pain in right shoulder: Secondary | ICD-10-CM | POA: Diagnosis not present

## 2020-04-11 DIAGNOSIS — Z4889 Encounter for other specified surgical aftercare: Secondary | ICD-10-CM | POA: Diagnosis not present

## 2020-04-11 DIAGNOSIS — M25511 Pain in right shoulder: Secondary | ICD-10-CM | POA: Diagnosis not present

## 2020-04-14 DIAGNOSIS — M25511 Pain in right shoulder: Secondary | ICD-10-CM | POA: Diagnosis not present

## 2020-04-14 DIAGNOSIS — Z4889 Encounter for other specified surgical aftercare: Secondary | ICD-10-CM | POA: Diagnosis not present

## 2020-04-18 DIAGNOSIS — Z9889 Other specified postprocedural states: Secondary | ICD-10-CM | POA: Diagnosis not present

## 2020-04-18 DIAGNOSIS — M25511 Pain in right shoulder: Secondary | ICD-10-CM | POA: Diagnosis not present

## 2020-04-23 DIAGNOSIS — Z4889 Encounter for other specified surgical aftercare: Secondary | ICD-10-CM | POA: Diagnosis not present

## 2020-04-23 DIAGNOSIS — M25511 Pain in right shoulder: Secondary | ICD-10-CM | POA: Diagnosis not present

## 2020-04-28 DIAGNOSIS — Z4889 Encounter for other specified surgical aftercare: Secondary | ICD-10-CM | POA: Diagnosis not present

## 2020-04-28 DIAGNOSIS — M25511 Pain in right shoulder: Secondary | ICD-10-CM | POA: Diagnosis not present

## 2020-05-01 DIAGNOSIS — M25511 Pain in right shoulder: Secondary | ICD-10-CM | POA: Diagnosis not present

## 2020-05-01 DIAGNOSIS — Z4889 Encounter for other specified surgical aftercare: Secondary | ICD-10-CM | POA: Diagnosis not present

## 2020-05-07 DIAGNOSIS — Z4889 Encounter for other specified surgical aftercare: Secondary | ICD-10-CM | POA: Diagnosis not present

## 2020-05-07 DIAGNOSIS — M25511 Pain in right shoulder: Secondary | ICD-10-CM | POA: Diagnosis not present

## 2020-05-09 DIAGNOSIS — Z4889 Encounter for other specified surgical aftercare: Secondary | ICD-10-CM | POA: Diagnosis not present

## 2020-05-09 DIAGNOSIS — M25511 Pain in right shoulder: Secondary | ICD-10-CM | POA: Diagnosis not present

## 2020-05-13 DIAGNOSIS — M25511 Pain in right shoulder: Secondary | ICD-10-CM | POA: Diagnosis not present

## 2020-05-13 DIAGNOSIS — Z4889 Encounter for other specified surgical aftercare: Secondary | ICD-10-CM | POA: Diagnosis not present

## 2020-05-16 DIAGNOSIS — Z4889 Encounter for other specified surgical aftercare: Secondary | ICD-10-CM | POA: Diagnosis not present

## 2020-05-16 DIAGNOSIS — M25511 Pain in right shoulder: Secondary | ICD-10-CM | POA: Diagnosis not present

## 2020-05-20 DIAGNOSIS — Z4889 Encounter for other specified surgical aftercare: Secondary | ICD-10-CM | POA: Diagnosis not present

## 2020-05-20 DIAGNOSIS — M25511 Pain in right shoulder: Secondary | ICD-10-CM | POA: Diagnosis not present

## 2020-05-23 DIAGNOSIS — M25511 Pain in right shoulder: Secondary | ICD-10-CM | POA: Diagnosis not present

## 2020-05-23 DIAGNOSIS — Z9889 Other specified postprocedural states: Secondary | ICD-10-CM | POA: Diagnosis not present

## 2020-05-23 DIAGNOSIS — Z4889 Encounter for other specified surgical aftercare: Secondary | ICD-10-CM | POA: Diagnosis not present

## 2020-05-26 DIAGNOSIS — Z4889 Encounter for other specified surgical aftercare: Secondary | ICD-10-CM | POA: Diagnosis not present

## 2020-05-26 DIAGNOSIS — M25511 Pain in right shoulder: Secondary | ICD-10-CM | POA: Diagnosis not present

## 2020-05-30 DIAGNOSIS — Z4889 Encounter for other specified surgical aftercare: Secondary | ICD-10-CM | POA: Diagnosis not present

## 2020-05-30 DIAGNOSIS — M25511 Pain in right shoulder: Secondary | ICD-10-CM | POA: Diagnosis not present

## 2020-06-02 DIAGNOSIS — Z4889 Encounter for other specified surgical aftercare: Secondary | ICD-10-CM | POA: Diagnosis not present

## 2020-06-02 DIAGNOSIS — M25511 Pain in right shoulder: Secondary | ICD-10-CM | POA: Diagnosis not present

## 2020-06-03 DIAGNOSIS — M25511 Pain in right shoulder: Secondary | ICD-10-CM | POA: Diagnosis not present

## 2020-06-16 DIAGNOSIS — Z4889 Encounter for other specified surgical aftercare: Secondary | ICD-10-CM | POA: Diagnosis not present

## 2020-06-16 DIAGNOSIS — M25511 Pain in right shoulder: Secondary | ICD-10-CM | POA: Diagnosis not present

## 2020-06-23 DIAGNOSIS — Z4889 Encounter for other specified surgical aftercare: Secondary | ICD-10-CM | POA: Diagnosis not present

## 2020-06-23 DIAGNOSIS — M25511 Pain in right shoulder: Secondary | ICD-10-CM | POA: Diagnosis not present

## 2020-07-01 DIAGNOSIS — Z4889 Encounter for other specified surgical aftercare: Secondary | ICD-10-CM | POA: Diagnosis not present

## 2020-07-01 DIAGNOSIS — M25511 Pain in right shoulder: Secondary | ICD-10-CM | POA: Diagnosis not present

## 2020-07-03 DIAGNOSIS — Z4889 Encounter for other specified surgical aftercare: Secondary | ICD-10-CM | POA: Diagnosis not present

## 2020-07-03 DIAGNOSIS — M25511 Pain in right shoulder: Secondary | ICD-10-CM | POA: Diagnosis not present

## 2020-07-08 DIAGNOSIS — Z4889 Encounter for other specified surgical aftercare: Secondary | ICD-10-CM | POA: Diagnosis not present

## 2020-07-08 DIAGNOSIS — M25511 Pain in right shoulder: Secondary | ICD-10-CM | POA: Diagnosis not present

## 2020-07-10 DIAGNOSIS — M25511 Pain in right shoulder: Secondary | ICD-10-CM | POA: Diagnosis not present

## 2020-07-10 DIAGNOSIS — Z4889 Encounter for other specified surgical aftercare: Secondary | ICD-10-CM | POA: Diagnosis not present

## 2020-07-10 DIAGNOSIS — Z9889 Other specified postprocedural states: Secondary | ICD-10-CM | POA: Diagnosis not present

## 2020-07-14 DIAGNOSIS — I2510 Atherosclerotic heart disease of native coronary artery without angina pectoris: Secondary | ICD-10-CM | POA: Diagnosis not present

## 2020-07-14 DIAGNOSIS — E782 Mixed hyperlipidemia: Secondary | ICD-10-CM | POA: Diagnosis not present

## 2020-07-14 DIAGNOSIS — I10 Essential (primary) hypertension: Secondary | ICD-10-CM | POA: Diagnosis not present

## 2020-07-15 DIAGNOSIS — M25511 Pain in right shoulder: Secondary | ICD-10-CM | POA: Diagnosis not present

## 2020-11-04 DEATH — deceased

## 2021-08-20 IMAGING — CT CT HEAD W/O CM
3 series · 16 of 47 positions shown, 19 images · non-contrast
Comparison: None.

CLINICAL DATA: Fall

EXAM:
CT HEAD WITHOUT CONTRAST
TECHNIQUE: Contiguous axial images were obtained from the base of the skull
through the vertex without intravenous contrast.

[Series 2: head wo · axial · 0.43mm/px · z∈[-144,-19]mm · 10 of 30 slices shown, 13 images]
[im 3/30  brain]
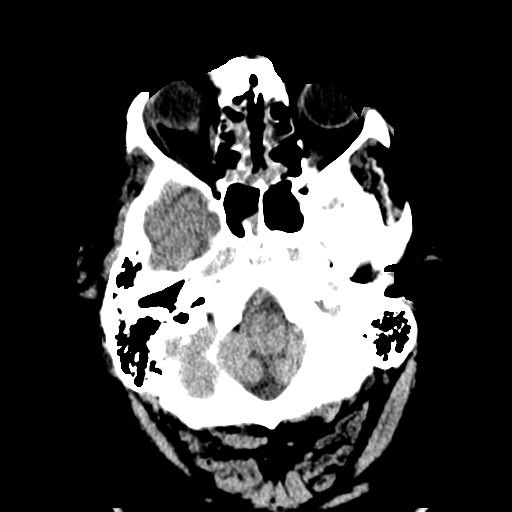
[im 3/30  bone]
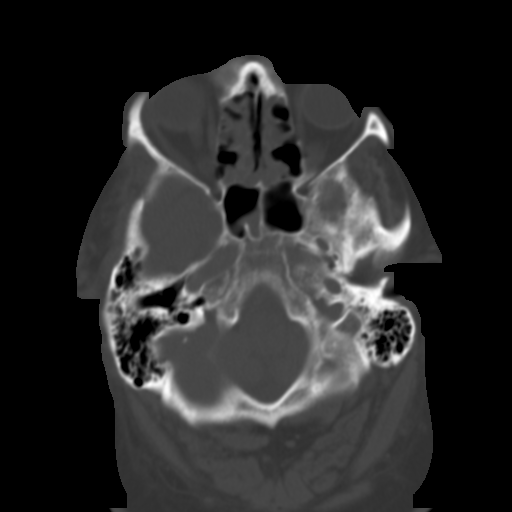
[im 6/30  brain]
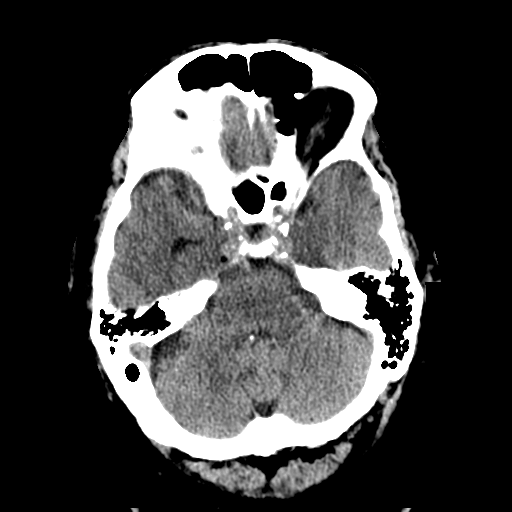
[im 9/30  brain]
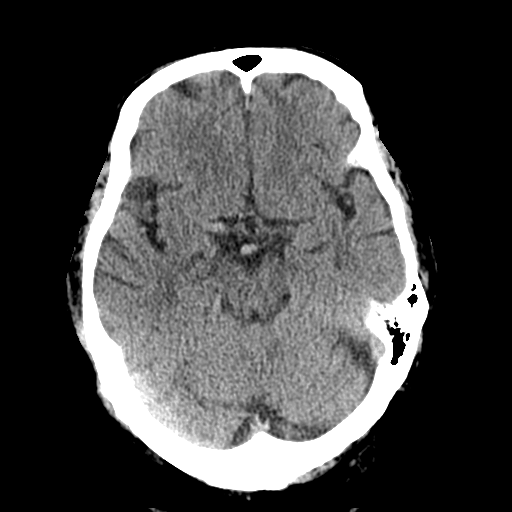
[im 11/30  brain]
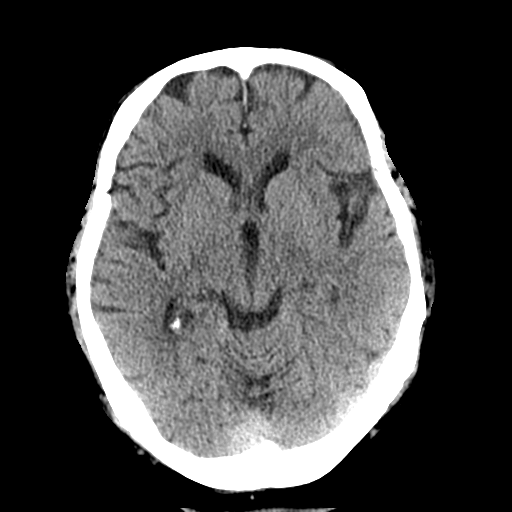
[im 14/30  brain]
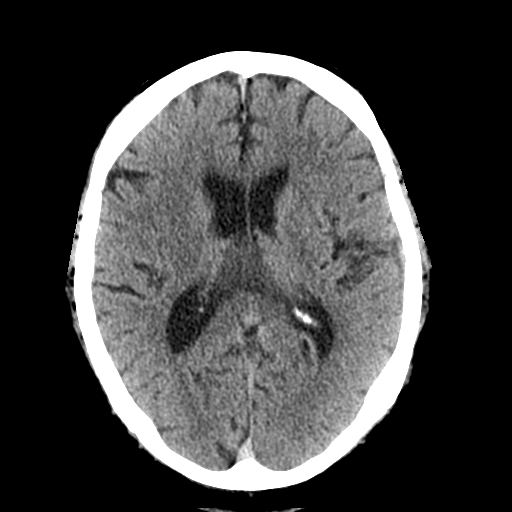
[im 14/30  bone]
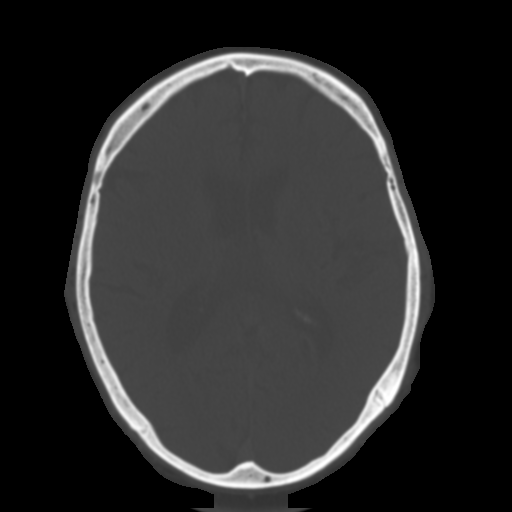
[im 17/30  brain]
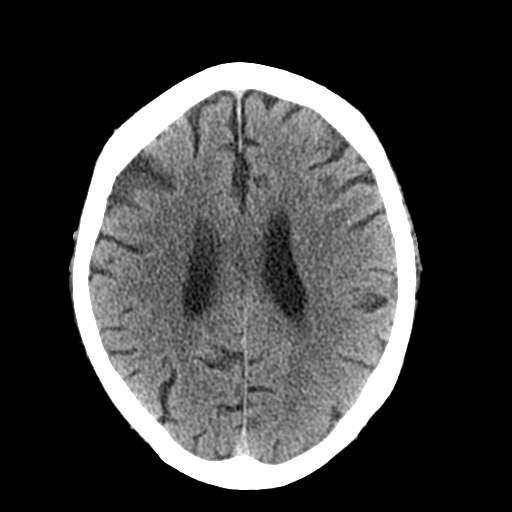
[im 20/30  brain]
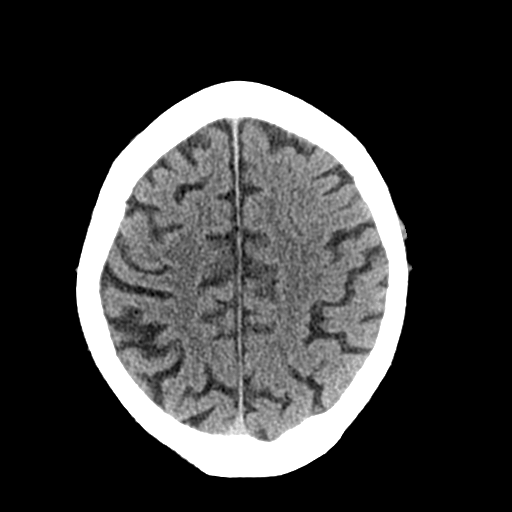
[im 23/30  brain]
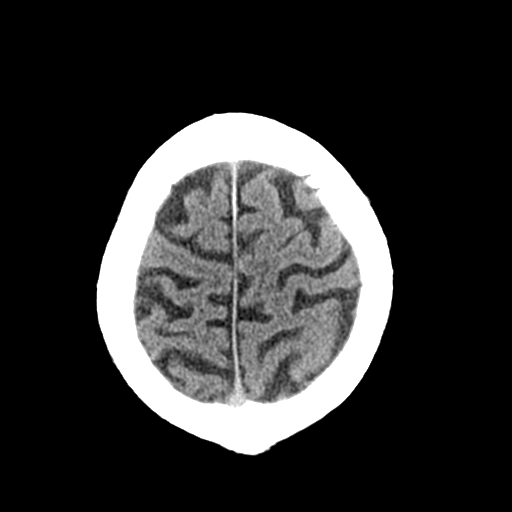
[im 25/30  brain]
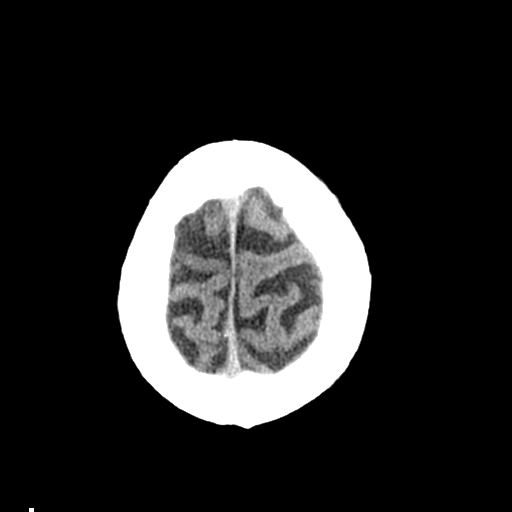
[im 25/30  bone]
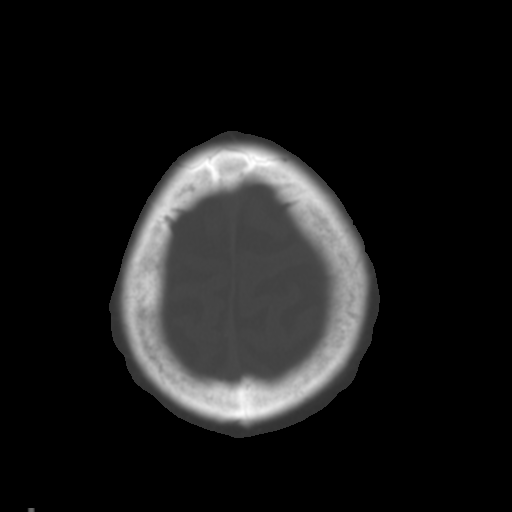
[im 28/30  brain]
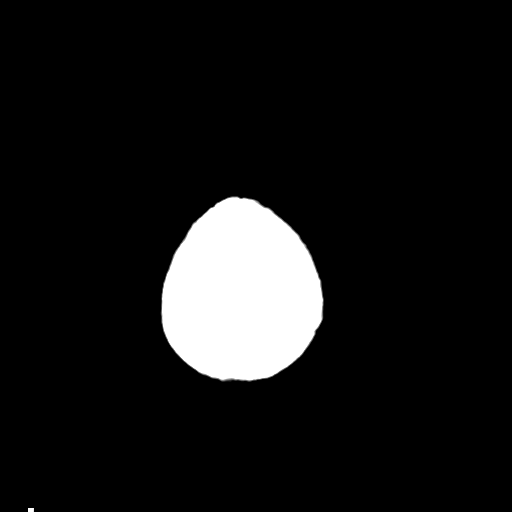

[Series 4: coronal soft tissue · coronal · 0.33mm/px · 3 of 65 slices shown]
[im 22/65  brain]
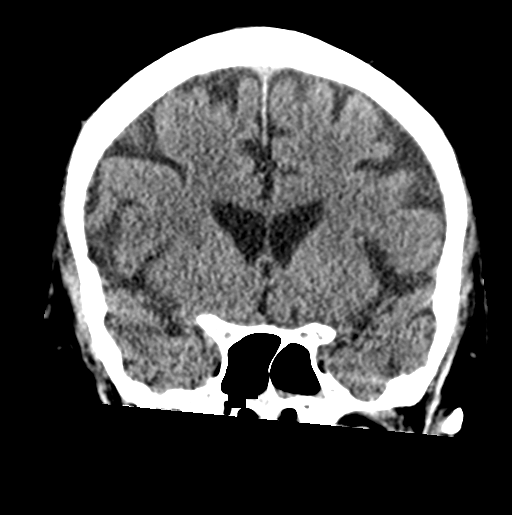
[im 29/65  brain]
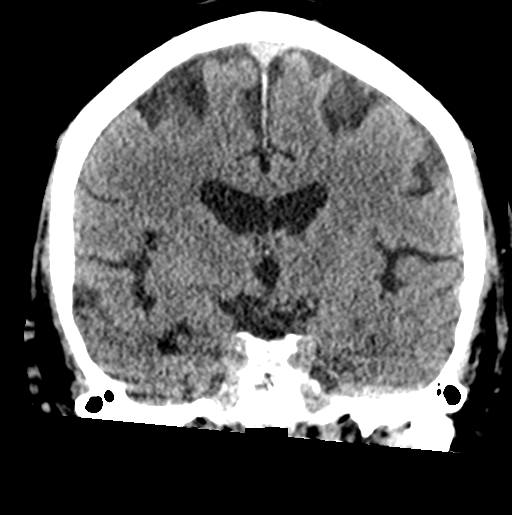
[im 36/65  brain]
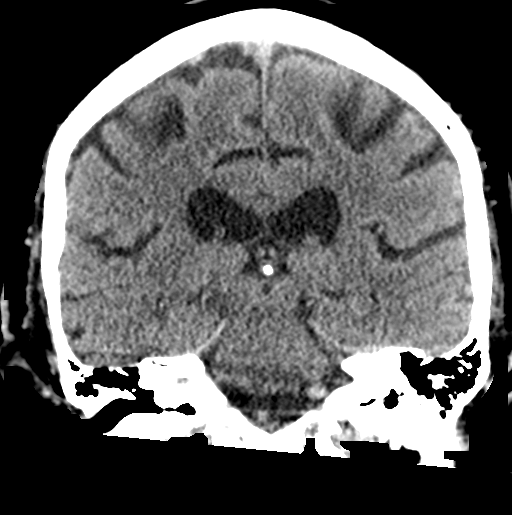

[Series 5: sagittal soft tissue · sagittal · 0.31mm/px · 3 of 52 slices shown]
[im 18/52  brain]
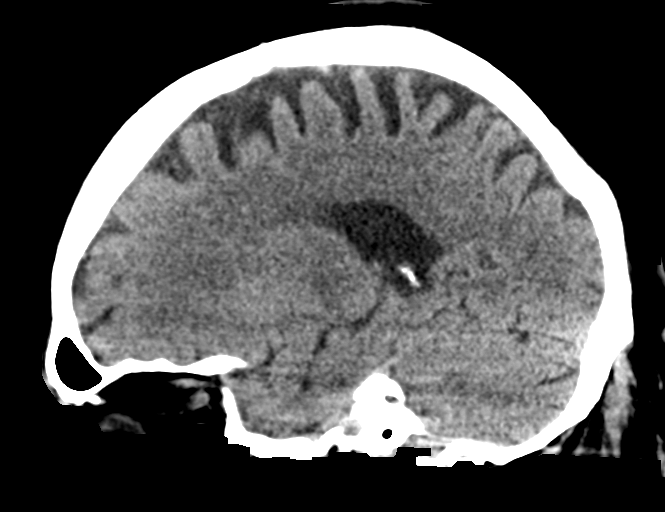
[im 26/52  brain]
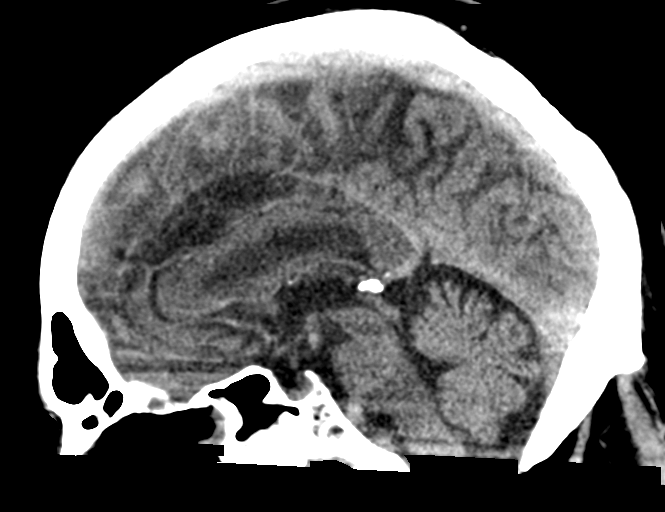
[im 35/52  brain]
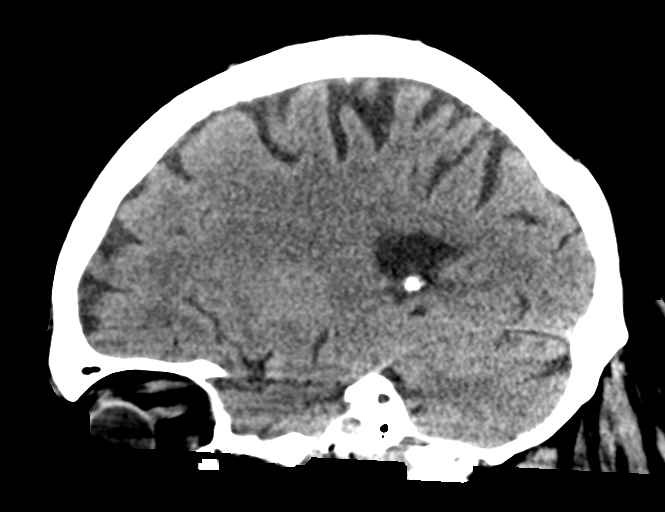

[16 of 47 positions shown; findings below may reference images not displayed]

FINDINGS: Brain: No acute intracranial hemorrhage. No focal mass lesion. No CT
evidence of acute infarction. No midline shift or mass effect. No
hydrocephalus. Basilar cisterns are patent.

There are periventricular and subcortical white matter
hypodensities. Generalized cortical atrophy.

Vascular: No hyperdense vessel or unexpected calcification.

Skull: Normal. Negative for fracture or focal lesion.

Sinuses/Orbits: There is fluid in the LEFT maxillary sinus.
Scattered opacification ethmoid air cells. No evidence of fracture.

Other: None.
IMPRESSION: 1. No intracranial trauma.
2. Maxillary and ethmoid sinusitis

## 2021-09-09 ENCOUNTER — Other Ambulatory Visit: Payer: Self-pay

## 2021-09-09 ENCOUNTER — Ambulatory Visit (INDEPENDENT_AMBULATORY_CARE_PROVIDER_SITE_OTHER): Payer: Medicare Other

## 2021-09-09 ENCOUNTER — Ambulatory Visit
Admission: EM | Admit: 2021-09-09 | Discharge: 2021-09-09 | Disposition: A | Discharge status: Home / Self Care | Payer: Medicare Other | Attending: Emergency Medicine | Admitting: Emergency Medicine

## 2021-09-09 DIAGNOSIS — J441 Chronic obstructive pulmonary disease with (acute) exacerbation: Secondary | ICD-10-CM | POA: Diagnosis not present

## 2021-09-09 DIAGNOSIS — R059 Cough, unspecified: Secondary | ICD-10-CM | POA: Diagnosis not present

## 2021-09-09 MED ORDER — BENZONATATE 100 MG PO CAPS: 200.0000 mg | ORAL_CAPSULE | Freq: Three times a day (TID) | ORAL | 0 refills | Status: DC

## 2021-09-09 MED ORDER — LEVOFLOXACIN 500 MG PO TABS: 500.0000 mg | ORAL_TABLET | Freq: Every day | ORAL | 0 refills | Status: DC

## 2021-09-09 MED ORDER — GUAIFENESIN-CODEINE 100-10 MG/5ML PO SYRP: 5.0000 mL | ORAL_SOLUTION | Freq: Three times a day (TID) | ORAL | 0 refills | Status: DC | PRN

## 2021-09-09 MED ORDER — PREDNISONE 20 MG PO TABS: 40.0000 mg | ORAL_TABLET | Freq: Every day | ORAL | 0 refills | Status: AC

## 2021-09-09 NOTE — ED Triage Notes (Signed)
Pt here with C/O cough for 2 months, states it is getting worst, unable to sleep at night. Has been taking Coricidin HBP

## 2021-09-09 NOTE — ED Provider Notes (Signed)
[facility]    CSN: [id number] Arrival date & time: 09/09/21  1057      History   Chief Complaint Chief Complaint  Patient presents with   Cough    HPI Tim Dean is a 76 y.o. male.   HPI  76 year old male here for evaluation of respiratory complaints.  Patient reports that for last 2 months he has been experiencing a cough that is productive for a clear to yellow sputum.  He has a history of COPD but has had to increase his albuterol inhaler use due to an increase in shortness of breath and wheezing.  He also endorses some runny nose and nasal congestion.  He has not had a fever, denies ear pain, sore throat, or GI complaints.  He has been taking Coricidin HBP without any improvement of symptoms.  Past Medical History:  Diagnosis Date   Cancer [facility])    COPD (chronic obstructive pulmonary disease) ([place])    Diabetes mellitus without complication ([place])    Hypercholesteremia    Hypertension    PTSD (post-traumatic stress disorder)     Patient Active Problem List   Diagnosis Date Noted   Chest pain 09/21/2019   Essential hypertension 09/21/2019   COPD with chronic bronchitis ([place]) 09/21/2019   OSA on CPAP 09/21/2019   Type 2 diabetes mellitus with hyperlipidemia ([place]) 09/21/2019   History of CVA (cerebrovascular accident) 09/21/2019   GERD (gastroesophageal reflux disease) 09/21/2019   Depression 09/21/2019    Past Surgical History:  Procedure Laterality Date   ABDOMINAL SURGERY     CHOLECYSTECTOMY         Home Medications    Prior to Admission medications   Medication Sig Start Date End Date Taking? Authorizing Provider  albuterol (PROVENTIL HFA;VENTOLIN HFA) 108 (90 BASE) MCG/ACT inhaler Inhale into the lungs every 6 (six) hours as needed for wheezing or shortness of breath.   Yes [provider]  Ascorbic Acid (VITAMIN C) 100 MG tablet Take 100 mg by mouth daily.   Yes [provider]  aspirin 81 MG tablet Take 81 mg by mouth  daily.   Yes [provider]  benzonatate (TESSALON) 100 MG capsule Take 2 capsules (200 mg total) by mouth every 8 (eight) hours. 09/09/21  Yes Margarette Canada, NP  budesonide-formoterol [facility]) 160-4.5 MCG/ACT inhaler Inhale 2 puffs into the lungs 2 (two) times daily.   Yes [provider]  cetirizine (ZYRTEC) 10 MG tablet Take 10 mg by mouth daily.   Yes [provider]  cholecalciferol (VITAMIN D) 1000 units tablet Take 25 Units by mouth 2 (two) times daily.   Yes [provider]  cyanocobalamin 100 MCG tablet Take 100 mcg by mouth daily.   Yes [provider]  docusate sodium (COLACE) 50 MG capsule Take 50 mg by mouth daily as needed for mild constipation.   Yes [provider]  DULoxetine (CYMBALTA) 30 MG capsule Take 60 mg by mouth daily.    Yes [provider]  fluticasone (FLONASE) 50 MCG/ACT nasal spray Place 2 sprays into both nostrils daily. 07/14/18  Yes Melynda Ripple, MD  guaiFENesin-codeine [facility]) 100-10 MG/5ML syrup Take 5 mLs by mouth 3 (three) times daily as needed for cough. 09/09/21  Yes Margarette Canada, NP  hydrochlorothiazide (HYDRODIURIL) 12.5 MG tablet Take 25 mg by mouth daily.    Yes [provider]  insulin glargine (LANTUS) 100 UNIT/ML injection Inject 50 Units into the skin at bedtime.    Yes  [provider]  insulin lispro (HUMALOG) 100 UNIT/ML injection Inject into the skin 3 (three) times daily before meals.   Yes [provider]  isosorbide dinitrate (ISORDIL) 30 MG tablet Take 30 mg by mouth daily.   Yes [provider]  levofloxacin (LEVAQUIN) 500 MG tablet Take 1 tablet (500 mg total) by mouth daily. 09/09/21  Yes Margarette Canada, NP  losartan (COZAAR) 25 MG tablet Take 25 mg by mouth daily.   Yes [provider]  metFORMIN (GLUCOPHAGE) 500 MG tablet Take by mouth 2 (two) times daily with a meal.   Yes [provider]  metoprolol tartrate  (LOPRESSOR) 25 MG tablet Take 50 mg by mouth 2 (two) times daily.    Yes [provider]  Multiple Vitamin (MULTIVITAMIN) tablet Take 1 tablet by mouth daily.   Yes [provider]  potassium chloride SA (KLOR-CON) 20 MEQ tablet Take 10 mEq by mouth daily.   Yes [provider]  prazosin (MINIPRESS) 5 MG capsule Take 5 mg by mouth 2 (two) times daily.    Yes [provider]  predniSONE (DELTASONE) 20 MG tablet Take 2 tablets (40 mg total) by mouth daily with breakfast for 5 days. 3 tablets daily for 5 days. 09/09/21 09/14/21 Yes Margarette Canada, NP  rosuvastatin (CRESTOR) 10 MG tablet Take 10 mg by mouth daily.    Yes [provider]  tiotropium (SPIRIVA) 18 MCG inhalation capsule Place 18 mcg into inhaler and inhale daily.   Yes [provider]  tiZANidine (ZANAFLEX) 4 MG tablet Take 1 tablet (4 mg total) by mouth every 8 (eight) hours as needed for muscle spasms. 01/06/20  Yes Melynda Ripple, MD  trazodone (DESYREL) 300 MG tablet Take 1 tablet (300 mg total) by mouth at bedtime. 09/09/19  Yes Cook, Jayce G, DO  vitamin E 100 UNIT capsule Take by mouth daily.   Yes [provider]  pantoprazole (PROTONIX) 40 MG tablet Take 1 tablet (40 mg total) by mouth 2 (two) times daily before a meal for 14 days. 09/22/19 01/06/20  Lavina Hamman, MD  ipratropium (ATROVENT) 0.06 % nasal spray Place 2 sprays into both nostrils 4 (four) times daily as needed for rhinitis. Patient not taking: Reported on 09/21/2019 11/30/18 01/06/20  Coral Spikes, DO    Family History Family History  Problem Relation Age of Onset   Cancer Mother    Lead poisoning Father     Social History Social History   Tobacco Use   Smoking status: Former    Types: Cigarettes    Quit date: 11/29/2000    Years since quitting: 20.7   Smokeless tobacco: Never  Vaping Use   Vaping Use: Never used  Substance Use Topics   Alcohol use: No   Drug use: Never     Allergies    Citalopram, Simvastatin, Iodinated contrast media, Lisinopril, Sertraline, Spironolactone, and Penicillins   Review of Systems Review of Systems  Constitutional:  Negative for activity change, appetite change and fever.  HENT:  Positive for congestion and rhinorrhea. Negative for ear pain and sore throat.   Respiratory:  Positive for cough, shortness of breath and wheezing.   Gastrointestinal:  Negative for diarrhea, nausea and vomiting.  Skin:  Negative for rash.  Hematological: Negative.   Psychiatric/Behavioral: Negative.      Physical Exam Triage Vital Signs ED Triage Vitals  Enc Vitals Group     BP 09/09/21 1243 (!) 152/87     Pulse Rate 09/09/21  1243 61     Resp 09/09/21 1243 18     Temp 09/09/21 1243 98 F (36.7 C)     Temp Source 09/09/21 1243 Oral     SpO2 09/09/21 1243 96 %     Weight 09/09/21 1242 236 lb (107 kg)     Height 09/09/21 1242 6' (1.829 m)     Head Circumference --      Peak Flow --      Pain Score 09/09/21 1242 5     Pain Loc --      Pain Edu? --      Excl. in [place]? --    No data found.  Updated Vital Signs BP (!) 152/87 (BP Location: Left Arm)    Pulse 61    Temp 98 F (36.7 C) (Oral)    Resp 18    Ht 6' (1.829 m)    Wt 236 lb (107 kg)    SpO2 96%    BMI 32.01 kg/m   Visual Acuity Right Eye Distance:   Left Eye Distance:   Bilateral Distance:    Right Eye Near:   Left Eye Near:    Bilateral Near:     Physical Exam Vitals and nursing note reviewed.  Constitutional:      General: He is not in acute distress.    Appearance: Normal appearance. He is not ill-appearing.  HENT:     Head: Normocephalic and atraumatic.     Right Ear: Tympanic membrane, ear canal and external ear normal. There is no impacted cerumen.     Left Ear: Tympanic membrane, ear canal and external ear normal. There is no impacted cerumen.     Nose: Congestion present. No rhinorrhea.     Mouth/Throat:     Mouth: Mucous membranes are moist.     Pharynx: Oropharynx is  clear. No posterior oropharyngeal erythema.  Cardiovascular:     Rate and Rhythm: Normal rate and regular rhythm.     Pulses: Normal pulses.     Heart sounds: Normal heart sounds. No murmur heard.   No friction rub. No gallop.  Pulmonary:     Effort: Pulmonary effort is normal.     Breath sounds: Wheezing present. No rhonchi or rales.  Musculoskeletal:     Cervical back: Normal range of motion and neck supple.  Lymphadenopathy:     Cervical: No cervical adenopathy.  Skin:    General: Skin is warm and dry.     Capillary Refill: Capillary refill takes less than 2 seconds.     Findings: No erythema or rash.  Neurological:     General: No focal deficit present.     Mental Status: He is alert and oriented to person, place, and time.  Psychiatric:        Mood and Affect: Mood normal.        Behavior: Behavior normal.        Thought Content: Thought content normal.        Judgment: Judgment normal.     UC Treatments / Results  Labs (all labs ordered are listed, but only abnormal results are displayed) Labs Reviewed - No data to display  EKG   Radiology DG Chest 2 View  Result Date: 09/09/2021 CLINICAL DATA:  Worsening cough for 2 months.  History of COPD. EXAM: CHEST - 2 VIEW COMPARISON:  Chest radiograph and CT 09/21/2019 FINDINGS: The cardiomediastinal silhouette is within normal limits. Aortic atherosclerosis is noted. The lungs are hyperinflated. No acute airspace  consolidation, edema, pleural effusion, or pneumothorax is identified. No acute osseous abnormality is seen. IMPRESSION: No active cardiopulmonary disease. Electronically Signed   By: Logan Bores M.D.   On: 09/09/2021 13:39    Procedures Procedures (including critical care time)  Medications Ordered in UC Medications - No data to display  Initial Impression / Assessment and Plan / UC Course  I have reviewed the triage vital signs and the nursing notes.  Pertinent labs & imaging results that were available  during my care of the patient were reviewed by me and considered in my medical decision making (see chart for details).  Patient is a nontoxic-appearing 76 year old male here for evaluation of cough that is been present for the past 2 months that is getting worse.  His cough really increases at night and is keeping him awake.  His cough is also productive for yellow to clear sputum.  He has had some minor runny nose and nasal congestion as well.  Patient is a history of COPD and has had to increase his albuterol inhaler use due to his shortness of breath and wheezing.  His physical exam reveals pearly gray tympanic membranes bilaterally with normal light reflex and clear external auditory canals.  Nasal mucosa is mildly edematous and pale with out significant rhinorrhea.  Oropharyngeal exam is benign.  No cervical lymphadenopathy appreciated exam.  Cardiopulmonary exam reveals wheezes in all lung fields.  No rhonchi or rales noted.  Suspect patient has a COPD exacerbation but due to the productive cough will obtain chest x-ray to look for possible pneumonia.  If no pneumonia on chest x-ray I will discharge patient home on Levaquin 500 mg daily for 7 days for treatment of COPD exacerbation.  Chest x-ray independently reviewed and evaluated by me.  Impression: Lung fields are well pneumatized.  There is no evidence of effusion or infiltrate.  Radiology read is pending. Radiology impression is no active cardiopulmonary disease.  Will discharge patient with a diagnosis of COPD exacerbation.  I will place him on Levaquin 5 mg daily for a week, 40 mg prednisone daily for 5 days, Tessalon Perles Fuster the day, and Cheratussin for use at nighttime.  He is to continue using his albuterol and Symbicort.  If his symptoms worsen he needs to go to the ER for evaluation.   Final Clinical Impressions(s) / UC Diagnoses   Final diagnoses:  COPD exacerbation ([place])     Discharge Instructions      Take the Levaquin  once daily for 7 days for treatment of your COPD exacerbation.  Use your albuterol inhaler, 2 puffs every 4-6 hours as needed for shortness breath or wheezing.  Take the prednisone 40 mg daily at breakfast time starting today.  Use the Tessalon Perles every 8 hours as needed for cough during the day.  They will sometimes cause numbness to the base of your tongue or give you metallic taste in your mouth.  This is normal.  You will need to take them with a small sip of water.  Use the Cheratussin cough syrup at bedtime as needed for cough and congestion.  Return for reevaluation for new or worsening symptoms.      ED Prescriptions     Medication Sig Dispense Auth. Provider   predniSONE (DELTASONE) 20 MG tablet Take 2 tablets (40 mg total) by mouth daily with breakfast for 5 days. 3 tablets daily for 5 days. 10 tablet Margarette Canada, NP   levofloxacin (LEVAQUIN) 500 MG tablet Take 1  tablet (500 mg total) by mouth daily. 7 tablet Margarette Canada, NP   benzonatate (TESSALON) 100 MG capsule Take 2 capsules (200 mg total) by mouth every 8 (eight) hours. 21 capsule Margarette Canada, NP   guaiFENesin-codeine (ROBITUSSIN AC) 100-10 MG/5ML syrup Take 5 mLs by mouth 3 (three) times daily as needed for cough. 120 mL Margarette Canada, NP      I have reviewed the PDMP during this encounter.   Margarette Canada, NP 09/09/21 1351

## 2021-09-09 NOTE — Discharge Instructions (Addendum)
Take the Levaquin once daily for 7 days for treatment of your COPD exacerbation.  Use your albuterol inhaler, 2 puffs every 4-6 hours as needed for shortness breath or wheezing.  Take the prednisone 40 mg daily at breakfast time starting today.  Use the Tessalon Perles every 8 hours as needed for cough during the day.  They will sometimes cause numbness to the base of your tongue or give you metallic taste in your mouth.  This is normal.  You will need to take them with a small sip of water.  Use the Cheratussin cough syrup at bedtime as needed for cough and congestion.  Return for reevaluation for new or worsening symptoms.

## 2023-11-04 ENCOUNTER — Encounter: Payer: Self-pay | Admitting: Emergency Medicine

## 2023-11-04 ENCOUNTER — Ambulatory Visit
Admission: EM | Admit: 2023-11-04 | Discharge: 2023-11-04 | Disposition: A | Discharge status: Home / Self Care | Payer: No Typology Code available for payment source | Attending: Physician Assistant | Admitting: Physician Assistant

## 2023-11-04 ENCOUNTER — Ambulatory Visit (INDEPENDENT_AMBULATORY_CARE_PROVIDER_SITE_OTHER): Payer: No Typology Code available for payment source

## 2023-11-04 DIAGNOSIS — R0602 Shortness of breath: Secondary | ICD-10-CM | POA: Diagnosis not present

## 2023-11-04 DIAGNOSIS — R052 Subacute cough: Secondary | ICD-10-CM

## 2023-11-04 DIAGNOSIS — J441 Chronic obstructive pulmonary disease with (acute) exacerbation: Secondary | ICD-10-CM

## 2023-11-04 MED ORDER — IPRATROPIUM-ALBUTEROL 0.5-2.5 (3) MG/3ML IN SOLN
3.0000 mL | Freq: Once | RESPIRATORY_TRACT | Status: AC
Start: 2023-11-04 — End: 2023-11-04
  Administered 2023-11-04: 3 mL via RESPIRATORY_TRACT

## 2023-11-04 MED ORDER — DOXYCYCLINE HYCLATE 100 MG PO CAPS: 100.0000 mg | ORAL_CAPSULE | Freq: Two times a day (BID) | ORAL | 0 refills | Status: AC

## 2023-11-04 MED ORDER — PREDNISONE 20 MG PO TABS: 40.0000 mg | ORAL_TABLET | Freq: Every day | ORAL | 0 refills | Status: AC

## 2023-11-04 MED ORDER — PROMETHAZINE-DM 6.25-15 MG/5ML PO SYRP: 5.0000 mL | ORAL_SOLUTION | Freq: Four times a day (QID) | ORAL | 0 refills | Status: AC | PRN

## 2023-11-04 NOTE — Discharge Instructions (Signed)
-  Your COPD is flared up -I sent an antibiotic, prednisone and cough medicine -If the radiologist sees pneumonia on your chest x-ray I will call you and send a different antibiotic -Continue inhalers -Go to ER if fever or worsening breathing problems.

## 2023-11-04 NOTE — ED Provider Notes (Signed)
 [facility]    CSN: [id number] Arrival date & time: 11/04/23  7829      History   Chief Complaint Chief Complaint  Patient presents with   Shortness of Breath   Cough    HPI Tim Dean is a 78 y.o. male with history of COPD, obstructive sleep apnea, hypertension, type 2 diabetes, GERD, hyperlipidemia, and PTSD presents for approximately 1 month history of productive cough, nasal congestion, fatigue and shortness of breath.  Symptoms got worse over the past couple weeks.  Has been using inhalers at home but have not really helped much.  Patient uses Symbicort, Spiriva, and albuterol.  He has not had any fevers, chest pain or weakness.  No other complaints.  HPI  Past Medical History:  Diagnosis Date   Cancer (HCC)    COPD (chronic obstructive pulmonary disease) (HCC)    Diabetes mellitus without complication (HCC)    Hypercholesteremia    Hypertension    PTSD (post-traumatic stress disorder)     Patient Active Problem List   Diagnosis Date Noted   Chest pain 09/21/2019   Essential hypertension 09/21/2019   COPD with chronic bronchitis (HCC) 09/21/2019   OSA on CPAP 09/21/2019   Type 2 diabetes mellitus with hyperlipidemia (HCC) 09/21/2019   History of CVA (cerebrovascular accident) 09/21/2019   GERD (gastroesophageal reflux disease) 09/21/2019   Depression 09/21/2019    Past Surgical History:  Procedure Laterality Date   ABDOMINAL SURGERY     CHOLECYSTECTOMY         Home Medications    Prior to Admission medications   Medication Sig Start Date End Date Taking? Authorizing Provider  doxycycline (VIBRAMYCIN) 100 MG capsule Take 1 capsule (100 mg total) by mouth 2 (two) times daily for 7 days. 11/04/23 11/11/23 Yes Shirlee Latch, PA-C  predniSONE (DELTASONE) 20 MG tablet Take 2 tablets (40 mg total) by mouth daily for 5 days. 11/04/23 11/09/23 Yes Shirlee Latch, PA-C  promethazine-dextromethorphan (PROMETHAZINE-DM) 6.25-15 MG/5ML syrup Take 5 mLs by  mouth 4 (four) times daily as needed. 11/04/23  Yes Eusebio Friendly B, PA-C  albuterol (PROVENTIL HFA;VENTOLIN HFA) 108 (90 BASE) MCG/ACT inhaler Inhale into the lungs every 6 (six) hours as needed for wheezing or shortness of breath.    [provider]  Ascorbic Acid (VITAMIN C) 100 MG tablet Take 100 mg by mouth daily.    [provider]  aspirin 81 MG tablet Take 81 mg by mouth daily.    [provider]  budesonide-formoterol (SYMBICORT) 160-4.5 MCG/ACT inhaler Inhale 2 puffs into the lungs 2 (two) times daily.    [provider]  cetirizine (ZYRTEC) 10 MG tablet Take 10 mg by mouth daily.    [provider]  cholecalciferol (VITAMIN D) 1000 units tablet Take 25 Units by mouth 2 (two) times daily.    [provider]  cyanocobalamin 100 MCG tablet Take 100 mcg by mouth daily.    [provider]  docusate sodium (COLACE) 50 MG capsule Take 50 mg by mouth daily as needed for mild constipation.    [provider]  DULoxetine (CYMBALTA) 30 MG capsule Take 60 mg by mouth daily.     [provider]  fluticasone (FLONASE) 50 MCG/ACT nasal spray Place 2 sprays into both nostrils daily. 07/14/18   Domenick Gong, MD  hydrochlorothiazide (HYDRODIURIL) 12.5 MG tablet Take 25 mg by mouth daily.     [provider]  insulin glargine (LANTUS) 100 UNIT/ML injection Inject  50 Units into the skin at bedtime.     [provider]  insulin lispro (HUMALOG) 100 UNIT/ML injection Inject into the skin 3 (three) times daily before meals.    [provider]  isosorbide dinitrate (ISORDIL) 30 MG tablet Take 30 mg by mouth daily.    [provider]  losartan (COZAAR) 25 MG tablet Take 25 mg by mouth daily.    [provider]  metFORMIN (GLUCOPHAGE) 500 MG tablet Take by mouth 2 (two) times daily with a meal.    [provider]  metoprolol tartrate (LOPRESSOR) 25 MG tablet Take 50 mg by mouth  2 (two) times daily.     [provider]  Multiple Vitamin (MULTIVITAMIN) tablet Take 1 tablet by mouth daily.    [provider]  pantoprazole (PROTONIX) 40 MG tablet Take 1 tablet (40 mg total) by mouth 2 (two) times daily before a meal for 14 days. 09/22/19 01/06/20  Rolly Salter, MD  potassium chloride SA (KLOR-CON) 20 MEQ tablet Take 10 mEq by mouth daily.    [provider]  prazosin (MINIPRESS) 5 MG capsule Take 5 mg by mouth 2 (two) times daily.     [provider]  rosuvastatin (CRESTOR) 10 MG tablet Take 10 mg by mouth daily.     [provider]  tiotropium (SPIRIVA) 18 MCG inhalation capsule Place 18 mcg into inhaler and inhale daily.    [provider]  tiZANidine (ZANAFLEX) 4 MG tablet Take 1 tablet (4 mg total) by mouth every 8 (eight) hours as needed for muscle spasms. 01/06/20   Domenick Gong, MD  trazodone (DESYREL) 300 MG tablet Take 1 tablet (300 mg total) by mouth at bedtime. 09/09/19   Tommie Sams, DO  vitamin E 100 UNIT capsule Take by mouth daily.    [provider]  ipratropium (ATROVENT) 0.06 % nasal spray Place 2 sprays into both nostrils 4 (four) times daily as needed for rhinitis. Patient not taking: Reported on 09/21/2019 11/30/18 01/06/20  Tommie Sams, DO    Family History Family History  Problem Relation Age of Onset   Cancer Mother    Lead poisoning Father     Social History Social History   Tobacco Use   Smoking status: Former    Current packs/day: 0.00    Types: Cigarettes    Quit date: 11/29/2000    Years since quitting: 22.9   Smokeless tobacco: Never  Vaping Use   Vaping status: Never Used  Substance Use Topics   Alcohol use: No   Drug use: Never     Allergies   Citalopram, Simvastatin, Iodinated contrast media, Lisinopril, Sertraline, Spironolactone, and Penicillins   Review of Systems Review of Systems  Constitutional:  Positive for fatigue. Negative for fever.  HENT:   Positive for congestion and rhinorrhea. Negative for sinus pressure, sinus pain and sore throat.   Respiratory:  Positive for cough, shortness of breath and wheezing.   Cardiovascular:  Negative for chest pain.  Gastrointestinal:  Negative for abdominal pain, diarrhea, nausea and vomiting.  Musculoskeletal:  Negative for myalgias.  Neurological:  Negative for weakness, light-headedness and headaches.  Hematological:  Negative for adenopathy.     Physical Exam Triage Vital Signs ED Triage Vitals  Encounter Vitals Group     BP 11/04/23 0939 129/63     Systolic BP Percentile --      Diastolic BP Percentile --      Pulse Rate 11/04/23 0939 76  Resp 11/04/23 0939 17     Temp 11/04/23 0939 97.9 F (36.6 C)     Temp Source 11/04/23 0939 Oral     SpO2 11/04/23 0939 93 %     Weight 11/04/23 0936 235 lb 14.3 oz (107 kg)     Height 11/04/23 0936 6' (1.829 m)     Head Circumference --      Peak Flow --      Pain Score 11/04/23 0936 4     Pain Loc --      Pain Education --      Exclude from Growth Chart --    No data found.  Updated Vital Signs BP 129/63 (BP Location: Right Arm)   Pulse 76   Temp 97.9 F (36.6 C) (Oral)   Resp 17   Ht 6' (1.829 m)   Wt 235 lb 14.3 oz (107 kg)   SpO2 93%   BMI 31.99 kg/m       Physical Exam Vitals and nursing note reviewed.  Constitutional:      General: He is not in acute distress.    Appearance: Normal appearance. He is well-developed. He is not ill-appearing.  HENT:     Head: Normocephalic and atraumatic.     Nose: Nose normal.     Mouth/Throat:     Mouth: Mucous membranes are moist.     Pharynx: Oropharynx is clear.  Eyes:     General: No scleral icterus.    Conjunctiva/sclera: Conjunctivae normal.  Cardiovascular:     Rate and Rhythm: Normal rate and regular rhythm.     Heart sounds: Normal heart sounds.  Pulmonary:     Effort: Pulmonary effort is normal. No respiratory distress.     Breath sounds: Wheezing present.      Comments: Poor airflow throughout.  Few scattered wheezes. Musculoskeletal:     Cervical back: Neck supple.  Skin:    General: Skin is warm and dry.     Capillary Refill: Capillary refill takes less than 2 seconds.  Neurological:     General: No focal deficit present.     Mental Status: He is alert. Mental status is at baseline.     Motor: No weakness.  Psychiatric:        Mood and Affect: Mood normal.        Behavior: Behavior normal.      UC Treatments / Results  Labs (all labs ordered are listed, but only abnormal results are displayed) Labs Reviewed - No data to display  EKG   Radiology DG Chest 2 View Result Date: 11/04/2023 CLINICAL DATA:  Cough and chest congestion for 1 month. EXAM: CHEST - 2 VIEW COMPARISON:  September 09, 2021. FINDINGS: The heart size and mediastinal contours are within normal limits. Both lungs are clear. The visualized skeletal structures are unremarkable. IMPRESSION: No active cardiopulmonary disease. Electronically Signed   By: Lupita Raider M.D.   On: 11/04/2023 11:36    Procedures Procedures (including critical care time)  Medications Ordered in UC Medications  ipratropium-albuterol (DUONEB) 0.5-2.5 (3) MG/3ML nebulizer solution 3 mL (3 mLs Nebulization Given 11/04/23 1030)    Initial Impression / Assessment and Plan / UC Course  I have reviewed the triage vital signs and the nursing notes.  Pertinent labs & imaging results that were available during my care of the patient were reviewed by me and considered in my medical decision making (see chart for details).   78 year old male with history of COPD, OSA,  hypertension, hyperlipidemia, GERD, and PTSD presents for 1 month history of productive cough, fatigue, nasal congestion, wheezing and shortness of breath.  Uses Symbicort, Spiriva and albuterol.  No associated fevers.  Concerned about possible pneumonia.  Vitals are stable.  He is overall well-appearing.  No acute distress.  On exam he  has reduced airflow throughout and few scattered wheezes.  Heart regular rate and rhythm.  Chest x-ray obtained to evaluate for possible pneumonia.  Patient given DuoNeb treatment in clinic.  Patient reports some improvement after DuoNeb treatment.  Chest x-ray negative for pneumonia.  Reviewed this with patient.  COPD exacerbation.  Supportive care encouraged.  Sent prednisone, doxycycline and Promethazine DM.  Advised to continue with inhalers and increase rest and fluids.  Reviewed keeping follow-up appointments.  Advised going to ED if fever or worsening of breathing.   Final Clinical Impressions(s) / UC Diagnoses   Final diagnoses:  Subacute cough  COPD exacerbation (HCC)  Shortness of breath     Discharge Instructions      -Your COPD is flared up -I sent an antibiotic, prednisone and cough medicine -If the radiologist sees pneumonia on your chest x-ray I will call you and send a different antibiotic -Continue inhalers -Go to ER if fever or worsening breathing problems.     ED Prescriptions     Medication Sig Dispense Auth. Provider   predniSONE (DELTASONE) 20 MG tablet Take 2 tablets (40 mg total) by mouth daily for 5 days. 10 tablet Shirlee Latch, PA-C   promethazine-dextromethorphan (PROMETHAZINE-DM) 6.25-15 MG/5ML syrup Take 5 mLs by mouth 4 (four) times daily as needed. 118 mL Eusebio Friendly B, PA-C   doxycycline (VIBRAMYCIN) 100 MG capsule Take 1 capsule (100 mg total) by mouth 2 (two) times daily for 7 days. 14 capsule Shirlee Latch, PA-C      PDMP not reviewed this encounter.   Shirlee Latch, PA-C 11/04/23 [phone]

## 2023-11-04 NOTE — ED Triage Notes (Signed)
 Patient has history of COPD.  Patient reports cough and chest congestion for a month.  Patient reports worsening breathing and SOB for a week.  Patient denies fevers.
